# Patient Record
Sex: Female | Born: 1985
Health system: Southern US, Community
[De-identification: ages and names within clinical notes are randomized; demographics above are authoritative.]

## PROBLEM LIST (undated history)

## (undated) DIAGNOSIS — R519 Headache, unspecified: Secondary | ICD-10-CM

## (undated) DIAGNOSIS — J45909 Unspecified asthma, uncomplicated: Secondary | ICD-10-CM

## (undated) DIAGNOSIS — F32A Depression, unspecified: Secondary | ICD-10-CM

## (undated) DIAGNOSIS — F329 Major depressive disorder, single episode, unspecified: Secondary | ICD-10-CM

## (undated) DIAGNOSIS — D649 Anemia, unspecified: Secondary | ICD-10-CM

## (undated) DIAGNOSIS — R51 Headache: Secondary | ICD-10-CM

---

## 2016-05-12 ENCOUNTER — Emergency Department (HOSPITAL_COMMUNITY): Payer: Medicare Other

## 2016-05-12 ENCOUNTER — Encounter (HOSPITAL_COMMUNITY): Payer: Self-pay | Admitting: *Deleted

## 2016-05-12 ENCOUNTER — Emergency Department (HOSPITAL_COMMUNITY)
Admission: EM | Admit: 2016-05-12 | Discharge: 2016-05-13 | Disposition: A | Discharge status: Home / Self Care | Payer: Medicare Other | Attending: Emergency Medicine | Admitting: Emergency Medicine

## 2016-05-12 DIAGNOSIS — J45909 Unspecified asthma, uncomplicated: Secondary | ICD-10-CM | POA: Insufficient documentation

## 2016-05-12 DIAGNOSIS — R000 Tachycardia, unspecified: Secondary | ICD-10-CM | POA: Insufficient documentation

## 2016-05-12 DIAGNOSIS — J020 Streptococcal pharyngitis: Secondary | ICD-10-CM | POA: Insufficient documentation

## 2016-05-12 DIAGNOSIS — Z79899 Other long term (current) drug therapy: Secondary | ICD-10-CM | POA: Insufficient documentation

## 2016-05-12 DIAGNOSIS — R002 Palpitations: Secondary | ICD-10-CM | POA: Diagnosis present

## 2016-05-12 HISTORY — DX: Unspecified asthma, uncomplicated: J45.909

## 2016-05-12 LAB — CBC WITH DIFFERENTIAL/PLATELET
Basophils Absolute: 0 10*3/uL (ref 0.0–0.1)
Basophils Relative: 0 %
Eosinophils Absolute: 0.1 10*3/uL (ref 0.0–0.7)
Eosinophils Relative: 0 %
HEMATOCRIT: 31.4 % — AB (ref 36.0–46.0)
HEMOGLOBIN: 9.5 g/dL — AB (ref 12.0–15.0)
LYMPHS PCT: 2 %
Lymphs Abs: 0.3 10*3/uL — ABNORMAL LOW (ref 0.7–4.0)
MCH: 19.6 pg — ABNORMAL LOW (ref 26.0–34.0)
MCHC: 30.3 g/dL (ref 30.0–36.0)
MCV: 64.9 fL — ABNORMAL LOW (ref 78.0–100.0)
Monocytes Absolute: 0.4 10*3/uL (ref 0.1–1.0)
Monocytes Relative: 3 %
NEUTROS ABS: 14.3 10*3/uL — AB (ref 1.7–7.7)
Neutrophils Relative %: 95 %
Platelets: 412 10*3/uL — ABNORMAL HIGH (ref 150–400)
RBC: 4.84 MIL/uL (ref 3.87–5.11)
RDW: 17.4 % — ABNORMAL HIGH (ref 11.5–15.5)
WBC: 15.1 10*3/uL — AB (ref 4.0–10.5)

## 2016-05-12 LAB — D-DIMER, QUANTITATIVE (NOT AT ARMC): D DIMER QUANT: 0.39 ug{FEU}/mL (ref 0.00–0.50)

## 2016-05-12 LAB — I-STAT CHEM 8, ED
BUN: 9 mg/dL (ref 6–20)
CHLORIDE: 106 mmol/L (ref 101–111)
Calcium, Ion: 1.25 mmol/L (ref 1.15–1.40)
Creatinine, Ser: 0.5 mg/dL (ref 0.44–1.00)
GLUCOSE: 111 mg/dL — AB (ref 65–99)
HCT: 34 % — ABNORMAL LOW (ref 36.0–46.0)
Hemoglobin: 11.6 g/dL — ABNORMAL LOW (ref 12.0–15.0)
POTASSIUM: 3.3 mmol/L — AB (ref 3.5–5.1)
SODIUM: 141 mmol/L (ref 135–145)
TCO2: 23 mmol/L (ref 0–100)

## 2016-05-12 LAB — BASIC METABOLIC PANEL
ANION GAP: 8 (ref 5–15)
BUN: 9 mg/dL (ref 6–20)
CALCIUM: 9.1 mg/dL (ref 8.9–10.3)
CHLORIDE: 106 mmol/L (ref 101–111)
CO2: 23 mmol/L (ref 22–32)
Creatinine, Ser: 0.55 mg/dL (ref 0.44–1.00)
GFR calc non Af Amer: >60 mL/min (ref >60)
Glucose, Bld: 117 mg/dL — ABNORMAL HIGH (ref 65–99)
POTASSIUM: 3.5 mmol/L (ref 3.5–5.1)
Sodium: 137 mmol/L (ref 135–145)

## 2016-05-12 LAB — URINALYSIS, ROUTINE W REFLEX MICROSCOPIC
Bilirubin Urine: NEGATIVE
Glucose, UA: NEGATIVE mg/dL
HGB URINE DIPSTICK: NEGATIVE
Ketones, ur: 20 mg/dL — AB
LEUKOCYTES UA: NEGATIVE
NITRITE: NEGATIVE
PROTEIN: NEGATIVE mg/dL
Specific Gravity, Urine: 1.042 — ABNORMAL HIGH (ref 1.005–1.030)
pH: 7 (ref 5.0–8.0)

## 2016-05-12 LAB — RAPID URINE DRUG SCREEN, HOSP PERFORMED
AMPHETAMINES: NOT DETECTED
BARBITURATES: NOT DETECTED
Benzodiazepines: NOT DETECTED
Cocaine: NOT DETECTED
OPIATES: POSITIVE — AB
TETRAHYDROCANNABINOL: POSITIVE — AB

## 2016-05-12 LAB — I-STAT BETA HCG BLOOD, ED (MC, WL, AP ONLY): I-stat hCG, quantitative: <5 m[IU]/mL (ref <5)

## 2016-05-12 LAB — RAPID STREP SCREEN (MED CTR MEBANE ONLY): Streptococcus, Group A Screen (Direct): POSITIVE — AB

## 2016-05-12 LAB — I-STAT CG4 LACTIC ACID, ED
Lactic Acid, Venous: 1.72 mmol/L (ref 0.5–1.9)
Lactic Acid, Venous: 1.57 mmol/L (ref 0.5–1.9)

## 2016-05-12 MED ORDER — SODIUM CHLORIDE 0.9 % IV BOLUS (SEPSIS)
1000.0000 mL | Freq: Once | INTRAVENOUS | Status: AC
  Administered 2016-05-12: 1000 mL via INTRAVENOUS

## 2016-05-12 MED ORDER — MAGNESIUM SULFATE 2 GM/50ML IV SOLN: 2.0000 g | Freq: Once | INTRAVENOUS | Status: DC

## 2016-05-12 MED ORDER — IOPAMIDOL (ISOVUE-370) INJECTION 76%
INTRAVENOUS | Status: AC
  Filled 2016-05-12: qty 100

## 2016-05-12 MED ORDER — IPRATROPIUM-ALBUTEROL 0.5-2.5 (3) MG/3ML IN SOLN: 3.0000 mL | Freq: Once | RESPIRATORY_TRACT | Status: DC

## 2016-05-12 MED ORDER — METHYLPREDNISOLONE SODIUM SUCC 125 MG IJ SOLR: 125.0000 mg | Freq: Every day | INTRAMUSCULAR | Status: DC

## 2016-05-12 MED ORDER — IOPAMIDOL (ISOVUE-370) INJECTION 76%
100.0000 mL | Freq: Once | INTRAVENOUS | Status: AC | PRN
  Administered 2016-05-12: 75 mL via INTRAVENOUS

## 2016-05-12 MED ORDER — IOPAMIDOL (ISOVUE-300) INJECTION 61%
75.0000 mL | Freq: Once | INTRAVENOUS | Status: AC | PRN
  Administered 2016-05-12: 75 mL via INTRAVENOUS

## 2016-05-12 MED ORDER — SODIUM CHLORIDE 0.9 % IV BOLUS (SEPSIS)
2000.0000 mL | Freq: Once | INTRAVENOUS | Status: AC
  Administered 2016-05-12: 2000 mL via INTRAVENOUS

## 2016-05-12 MED ORDER — AMOXICILLIN 500 MG PO CAPS
500.0000 mg | ORAL_CAPSULE | Freq: Once | ORAL | Status: AC
  Administered 2016-05-12: 500 mg via ORAL
  Filled 2016-05-12: qty 1

## 2016-05-12 MED ORDER — ONDANSETRON HCL 4 MG/2ML IJ SOLN
4.0000 mg | Freq: Once | INTRAMUSCULAR | Status: AC
  Administered 2016-05-12: 4 mg via INTRAVENOUS
  Filled 2016-05-12: qty 2

## 2016-05-12 MED ORDER — IOPAMIDOL (ISOVUE-370) INJECTION 76%: 100.0000 mL | Freq: Once | INTRAVENOUS | Status: DC | PRN

## 2016-05-12 MED ORDER — HALOPERIDOL LACTATE 5 MG/ML IJ SOLN
5.0000 mg | Freq: Once | INTRAMUSCULAR | Status: AC
  Administered 2016-05-12: 5 mg via INTRAVENOUS
  Filled 2016-05-12: qty 1

## 2016-05-12 MED ORDER — LORAZEPAM 2 MG/ML IJ SOLN
2.0000 mg | Freq: Once | INTRAMUSCULAR | Status: AC
  Administered 2016-05-12: 2 mg via INTRAVENOUS
  Filled 2016-05-12: qty 1

## 2016-05-12 MED ORDER — MORPHINE SULFATE (PF) 4 MG/ML IV SOLN
4.0000 mg | Freq: Once | INTRAVENOUS | Status: AC
  Administered 2016-05-12: 4 mg via INTRAVENOUS
  Filled 2016-05-12: qty 1

## 2016-05-12 MED ORDER — DEXAMETHASONE SODIUM PHOSPHATE 10 MG/ML IJ SOLN
10.0000 mg | Freq: Once | INTRAMUSCULAR | Status: AC
  Administered 2016-05-12: 10 mg via INTRAVENOUS
  Filled 2016-05-12: qty 1

## 2016-05-12 NOTE — ED Notes (Signed)
Hospitalist at bedside 

## 2016-05-12 NOTE — ED Notes (Signed)
Instructed pt that we need her to lay in the bed so she doesn't fall. Pt will put both feet in bed for 2 min. Then pt sits on side of bed and rocks.

## 2016-05-12 NOTE — ED Provider Notes (Signed)
[facility]-EMERGENCY DEPT Provider Note   CSN: [id number] Arrival date & time: 05/12/16  1705     History   Chief Complaint Chief Complaint  Patient presents with  . Cough  . Shortness of Breath   HPI   Blood pressure 124/79, pulse (!) 144, temperature 99.2 F (37.3 C), temperature source Oral, resp. rate 16, last menstrual period 05/09/2016, SpO2 100 %.  Alexandra Ross is a 31 y.o. female with past medical history significant for asthma, has never been hospitalized or intubated for complications) complaining of racing heart and sore throat and difficulty swallowing worsening over the course of 3 days. He has been around people that are sick with strep. She denies fevers, chills, nausea, vomiting, history of DVT/PE, recent immobilizations, calf pain, leg swelling. She denies illicit drug use, alcohol intoxication, psychiatric history psychiatric medications.   Past Medical History:  Diagnosis Date  . Asthma     There are no active problems to display for this patient.   Past Surgical History:  Procedure Laterality Date  . CESAREAN SECTION      OB History    No data available       Home Medications    Prior to Admission medications   Medication Sig Start Date End Date Taking? Authorizing Provider  amoxicillin (AMOXIL) 500 MG capsule Take 2 capsules (1,000 mg total) by mouth 2 (two) times daily. 05/13/16   Joni Reining Budd Freiermuth, PA-C    Family History No family history on file.  Social History Social History  Substance Use Topics  . Smoking status: Never Smoker  . Smokeless tobacco: Never Used  . Alcohol use No     Allergies   Patient has no known allergies.   Review of Systems Review of Systems  10 systems reviewed and found to be negative, except as noted in the HPI.   Physical Exam Updated Vital Signs BP 106/65   Pulse (!) 125   Temp 99.9 F (37.7 C) (Rectal)   Resp 20   LMP 05/09/2016   SpO2 96%   Physical Exam  Constitutional: She is oriented to  person, place, and time. She appears well-developed and well-nourished.  Leaning to her right, will not sit up, states that it is very painful to speak and is asking for a pen to write instead of talking.  HENT:  Head: Normocephalic and atraumatic.  Mouth/Throat: Oropharynx is clear and moist.  Mucous membranes moist, uvula midline, soft palate rises symmetrically, mild tonsillar hypertrophy bilaterally, patient is spitting out her secretions  Eyes: Conjunctivae and EOM are normal. Pupils are equal, round, and reactive to light.  Neck: Normal range of motion.  Cardiovascular: Regular rhythm and intact distal pulses.   Severe tachycardia, grossly regular  Pulmonary/Chest: Effort normal and breath sounds normal.  Abdominal: Soft. There is no tenderness.  Musculoskeletal: Normal range of motion.  Neurological: She is alert and oriented to person, place, and time.  Skin: She is not diaphoretic.  Psychiatric: She has a normal mood and affect.  Nursing note and vitals reviewed.    ED Treatments / Results  Labs (all labs ordered are listed, but only abnormal results are displayed) Labs Reviewed  RAPID STREP SCREEN (NOT AT [facility]) - Abnormal; Notable for the following:       Result Value   Streptococcus, Group A Screen (Direct) POSITIVE (*)    All other components within normal limits  CBC WITH DIFFERENTIAL/PLATELET - Abnormal; Notable for the following:    WBC 15.1 (*)  Hemoglobin 9.5 (*)    HCT 31.4 (*)    MCV 64.9 (*)    MCH 19.6 (*)    RDW 17.4 (*)    Platelets 412 (*)    Neutro Abs 14.3 (*)    Lymphs Abs 0.3 (*)    All other components within normal limits  BASIC METABOLIC PANEL - Abnormal; Notable for the following:    Glucose, Bld 117 (*)    All other components within normal limits  URINALYSIS, ROUTINE W REFLEX MICROSCOPIC - Abnormal; Notable for the following:    APPearance HAZY (*)    Specific Gravity, Urine 1.042 (*)    Ketones, ur 20 (*)    All other components  within normal limits  RAPID URINE DRUG SCREEN, HOSP PERFORMED - Abnormal; Notable for the following:    Opiates POSITIVE (*)    Tetrahydrocannabinol POSITIVE (*)    All other components within normal limits  I-STAT CHEM 8, ED - Abnormal; Notable for the following:    Potassium 3.3 (*)    Glucose, Bld 111 (*)    Hemoglobin 11.6 (*)    HCT 34.0 (*)    All other components within normal limits  D-DIMER, QUANTITATIVE (NOT AT [facility])  I-STAT CG4 LACTIC ACID, ED  I-STAT BETA HCG BLOOD, ED ([facility], [facility], [facility] ONLY)  I-STAT CG4 LACTIC ACID, ED    EKG  EKG Interpretation None       Radiology Ct Soft Tissue Neck W Contrast  Result Date: 05/12/2016 CLINICAL DATA:  Sore throat, difficulty swallowing for 3 days. EXAM: CT NECK WITH CONTRAST TECHNIQUE: Multidetector CT imaging of the neck was performed using the standard protocol following the bolus administration of intravenous contrast. CONTRAST:  75mL ISOVUE-300 IOPAMIDOL (ISOVUE-300) INJECTION 61% COMPARISON:  None. FINDINGS: Mild motion degraded examination. Pharynx and larynx: Normal. Salivary glands: Normal. Thyroid: Normal. Lymph nodes: No lymphadenopathy by CT size criteria. Vascular: Normal. Limited intracranial: Normal. Visualized orbits: Normal. Mastoids and visualized paranasal sinuses: Trace paranasal sinus mucosal thickening with subcentimeter LEFT maxillary mucosal retention cyst. Mastoid air cells are well aerated. Skeleton: No acute osseous process or destructive bony lesions. Dental malocclusion. In Upper chest: Lung apices are well aerated. No superior mediastinal lymphadenopathy. Minimal residual thymic tissue. Other: None. IMPRESSION: Negative mildly motion degraded CT neck. Electronically Signed   By: Awilda Metro M.D.   On: 05/12/2016 19:58   Ct Angio Chest Pe W And/or Wo Contrast  Result Date: 05/12/2016 CLINICAL DATA:  Tachycardia. Cough and chills. Chest pain and shortness of breath. EXAM: CT ANGIOGRAPHY CHEST WITH CONTRAST  TECHNIQUE: Multidetector CT imaging of the chest was performed using the standard protocol during bolus administration of intravenous contrast. Multiplanar CT image reconstructions and MIPs were obtained to evaluate the vascular anatomy. CONTRAST:  75 cc Isovue 370 IV COMPARISON:  Radiograph earlier this day FINDINGS: Cardiovascular: There are no filling defects within the pulmonary arteries to suggest pulmonary embolus. No thoracic aortic dissection or aneurysm. Normal heart size. No pericardial effusion. Mediastinum/Nodes: No mediastinal or hilar adenopathy. The esophagus is decompressed. Visualized thyroid gland is unremarkable. Lungs/Pleura: Mild motion artifact. No consolidation, pulmonary edema or pleural fluid. Tiny subpleural opacity in the right upper lobe is likely atelectasis or scarring. No pulmonary mass or suspicious nodule. Upper Abdomen: No acute abnormality. Musculoskeletal: There are no acute or suspicious osseous abnormalities. Small Schmorl's nodes in the low thoracic spine. Review of the MIP images confirms the above findings. IMPRESSION: No pulmonary embolus or acute intrathoracic abnormality. Electronically Signed  By: Rubye Oaks M.D.   On: 05/12/2016 22:53   Dg Chest Portable 1 View  Result Date: 05/12/2016 CLINICAL DATA:  Cough and chills for 2 days, shortness of breath and mid chest pain. Tachycardia. History of asthma. EXAM: PORTABLE CHEST 1 VIEW COMPARISON:  None. FINDINGS: The heart size and mediastinal contours are within normal limits. Both lungs are clear. The visualized skeletal structures are unremarkable. IMPRESSION: Normal chest. Electronically Signed   By: Awilda Metro M.D.   On: 05/12/2016 18:14    Procedures Procedures (including critical care time)  Medications Ordered in ED Medications  iopamidol (ISOVUE-370) 76 % injection (not administered)  sodium chloride 0.9 % bolus 1,000 mL (0 mLs Intravenous Stopped 05/12/16 1939)  dexamethasone (DECADRON)  injection 10 mg (10 mg Intravenous Given 05/12/16 1831)  morphine 4 MG/ML injection 4 mg (4 mg Intravenous Given 05/12/16 1831)  ondansetron (ZOFRAN) injection 4 mg (4 mg Intravenous Given 05/12/16 1831)  sodium chloride 0.9 % bolus 2,000 mL (0 mLs Intravenous Stopped 05/12/16 2356)  iopamidol (ISOVUE-300) 61 % injection 75 mL (75 mLs Intravenous Contrast Given 05/12/16 1911)  LORazepam (ATIVAN) injection 2 mg (2 mg Intravenous Given 05/12/16 2047)  amoxicillin (AMOXIL) capsule 500 mg (500 mg Oral Given 05/12/16 2047)  sodium chloride 0.9 % bolus 1,000 mL (1,000 mLs Intravenous New Bag/Given 05/12/16 2206)  haloperidol lactate (HALDOL) injection 5 mg (5 mg Intravenous Given 05/12/16 2206)  iopamidol (ISOVUE-370) 76 % injection 100 mL (75 mLs Intravenous Contrast Given 05/12/16 2232)     Initial Impression / Assessment and Plan / ED Course  I have reviewed the triage vital signs and the nursing notes.  Pertinent labs & imaging results that were available during my care of the patient were reviewed by me and considered in my medical decision making (see chart for details).    Vitals:   05/12/16 2310 05/12/16 2351 05/13/16 0000 05/13/16 0041  BP: 105/57 111/65 95/72 106/65  Pulse: (!) 123 (!) 139 (!) 130 (!) 125  Resp: 17 16  20   Temp: 99.9 F (37.7 C)     TempSrc: Rectal     SpO2: 100% 99% 99% 96%    Medications  iopamidol (ISOVUE-370) 76 % injection (not administered)  sodium chloride 0.9 % bolus 1,000 mL (0 mLs Intravenous Stopped 05/12/16 1939)  dexamethasone (DECADRON) injection 10 mg (10 mg Intravenous Given 05/12/16 1831)  morphine 4 MG/ML injection 4 mg (4 mg Intravenous Given 05/12/16 1831)  ondansetron (ZOFRAN) injection 4 mg (4 mg Intravenous Given 05/12/16 1831)  sodium chloride 0.9 % bolus 2,000 mL (0 mLs Intravenous Stopped 05/12/16 2356)  iopamidol (ISOVUE-300) 61 % injection 75 mL (75 mLs Intravenous Contrast Given 05/12/16 1911)  LORazepam (ATIVAN) injection 2 mg (2 mg Intravenous  Given 05/12/16 2047)  amoxicillin (AMOXIL) capsule 500 mg (500 mg Oral Given 05/12/16 2047)  sodium chloride 0.9 % bolus 1,000 mL (1,000 mLs Intravenous New Bag/Given 05/12/16 2206)  haloperidol lactate (HALDOL) injection 5 mg (5 mg Intravenous Given 05/12/16 2206)  iopamidol (ISOVUE-370) 76 % injection 100 mL (75 mLs Intravenous Contrast Given 05/12/16 2232)    Alexandra Ross is 31 y.o. female presenting with Cough, sore throat, severe tachycardia in the 140s to 150s. Lung sounds are clear, she is spitting out her secretions, question epiglottitis versus PTA. Patient is afebrile, nontoxic appearing. Will check a CT soft tissue neck.   Patient declines strep test states the pain is too severe, she's been given morphine, Decadron and aggressive hydration.  CT of  the neck is negative, advised her we will need to get a strep test and I have personally swabbed her tonsils.  Rapid strep is positive, patient started on amoxicillin, she is not vomiting, she remains persistently tachycardic despite pain medication and hydration. Will try Ativan.  Patient has taken off her pants and hung her gluteus over the edge of the bed. When questioned by nurses to why she did this she states "I have to go to the bathroom." She is encouraged to use her call bell and covered with gown and blanket.   After 2 mg of Ativan she remains tachycardic in the 120s. She's had a normal lactic acid 2, patient is sleeping with heart rate in the 120s. Will obtain UDS, CTA, check rectal temperature .   Geodon given and patient persistently remains tachycardic.  UDS was no cocaine/amphetamines. Positive for opiates (morphine was given for pain earlier in visit) and also THC. Does not explain her tachycardia.  She has received 4L of fluids, Rectal temperature normal, CTA negative. Will need admission for persistent tachycardia.  Consult from triad hospitalist Dr. Katrinka BlazingSmith appreciated: He will evaluate the patient.   Dr. Katrinka BlazingSmith states  that the patient is appropriate for discharge, as she is tolerating by mouth's and able to take her medications at home. Extensive discussion with Dr. Katrinka BlazingSmith on the degree of tachycardia that this patient is exhibiting, he feels comfortable sending her, and again declines admission. Discussed with attending physician, patient given extensive return precautions and she verbalized understanding and teach back technique. Nurse will outpatient chart her phone so she can call family member to pick her up.   Final Clinical Impressions(s) / ED Diagnoses   Final diagnoses:  Strep pharyngitis  Tachycardia    New Prescriptions New Prescriptions   AMOXICILLIN (AMOXIL) 500 MG CAPSULE    Take 2 capsules (1,000 mg total) by mouth 2 (two) times daily.     Alexandra Emeryicole Arayah Krouse, PA-C 05/13/16 0050    Shaune Pollackameron Isaacs, MD 05/13/16 1226

## 2016-05-12 NOTE — ED Notes (Signed)
Pt pulled out her IV and left it running in the floor. When this RN discovered it, she states that it leaked so she pulled it out.

## 2016-05-12 NOTE — ED Triage Notes (Signed)
Pt reports not feeling well for the past few days.  Pt reports coughing and chills.  Pt reports SOB and pain in chest.  Pt denies N/V.

## 2016-05-12 NOTE — ED Notes (Signed)
Pt keeps spitting in emesis bag. Pt asked for crackers, informed pt that I can not provide her crackers if she is vomiting.

## 2016-05-13 DIAGNOSIS — R000 Tachycardia, unspecified: Secondary | ICD-10-CM | POA: Diagnosis not present

## 2016-05-13 DIAGNOSIS — J020 Streptococcal pharyngitis: Secondary | ICD-10-CM

## 2016-05-13 MED ORDER — AMOXICILLIN 500 MG PO CAPS: 1000.0000 mg | ORAL_CAPSULE | Freq: Two times a day (BID) | ORAL | 0 refills | Status: DC

## 2016-05-13 MED ORDER — SODIUM CHLORIDE 0.9 % IV BOLUS (SEPSIS)
1000.0000 mL | Freq: Once | INTRAVENOUS | Status: AC
  Administered 2016-05-13: 1000 mL via INTRAVENOUS

## 2016-05-13 NOTE — ED Notes (Signed)
Pt calling for her ride

## 2016-05-13 NOTE — Discharge Instructions (Signed)
Do not hesitate to return to the emergency department for any new, worsening or concerning symptoms.

## 2016-05-13 NOTE — ED Notes (Signed)
Pt ambulated to the restroom without any assistance. 

## 2016-05-13 NOTE — ED Notes (Signed)
Pt left phone in the bathroom. Phone was returned by nurse tech

## 2016-05-13 NOTE — ED Notes (Signed)
Pt ambulated to restroom without difficulty

## 2016-05-13 NOTE — ED Notes (Signed)
Pt stood up while looking for her phone and vomited green liquid.

## 2016-05-13 NOTE — ED Provider Notes (Signed)
Asked by charge nurse to see patient. Patient was seen earlier this evening and after extensive evaluation was found to have strep pharyngitis. She however has continued to have tachycardia with soft blood pressures. Nurse reports patient seems confused however she did get Haldol and lorazepam earlier in the evening.  4:35 AM patient's heart rate is 115 and jumped up to 129 when I woke her up, blood pressure 101/65 with pulse ox 100%. She states she's not feeling much better.  04:59 Dr Arlyss Queen Smith, states he felt her altered mental status was from the ativan and haldol. Does not feel she needs to be admitted.      Alexandra AlbeIva Raekwon Winkowski, MD 05/13/16 [phone]

## 2016-05-13 NOTE — Progress Notes (Signed)
31 year old female pmh of asthma; who presents with complains of cough, shortness of breath, and sore throat for last 3 days. Initial vitals revealed pulse of 144, respiration rate of 25, but all other vitals within normal limits. Lab work revealed WBC of 15.1, positive for strep throa, UDS positive for marijuana( also found to be positive for opioids, but patient given morphine). She was started on oral amoxicillin and ordered 4 L of IV fluids. It appears patient with persistent tachycardia with EKG revealing sinus tachycardia with no ischemic changes. Workup including CT of the neck and CT angiogram of the chest otherwise noted to be negative. In addition to completing fluids appears she was given morphine, Ativan, and Haldol for treatment of tachycardia. Patient to be resting with heart rates in the 120s completing rest of fluid bolus no need of admission at this time.

## 2016-05-13 NOTE — ED Notes (Signed)
Pt phone located. Charging it now in pt room.

## 2016-06-17 ENCOUNTER — Inpatient Hospital Stay (HOSPITAL_COMMUNITY)
Admission: AD | Admit: 2016-06-17 | Discharge: 2016-06-17 | Disposition: A | Discharge status: Home / Self Care | Payer: Medicare Other | Source: Ambulatory Visit | Attending: Obstetrics and Gynecology | Admitting: Obstetrics and Gynecology

## 2016-06-17 ENCOUNTER — Encounter (HOSPITAL_COMMUNITY): Payer: Self-pay | Admitting: *Deleted

## 2016-06-17 DIAGNOSIS — M549 Dorsalgia, unspecified: Secondary | ICD-10-CM | POA: Insufficient documentation

## 2016-06-17 DIAGNOSIS — B9689 Other specified bacterial agents as the cause of diseases classified elsewhere: Secondary | ICD-10-CM | POA: Insufficient documentation

## 2016-06-17 DIAGNOSIS — Z9889 Other specified postprocedural states: Secondary | ICD-10-CM | POA: Diagnosis not present

## 2016-06-17 DIAGNOSIS — Z8249 Family history of ischemic heart disease and other diseases of the circulatory system: Secondary | ICD-10-CM | POA: Insufficient documentation

## 2016-06-17 DIAGNOSIS — B3731 Acute candidiasis of vulva and vagina: Secondary | ICD-10-CM

## 2016-06-17 DIAGNOSIS — F329 Major depressive disorder, single episode, unspecified: Secondary | ICD-10-CM | POA: Insufficient documentation

## 2016-06-17 DIAGNOSIS — Z833 Family history of diabetes mellitus: Secondary | ICD-10-CM | POA: Diagnosis not present

## 2016-06-17 DIAGNOSIS — N760 Acute vaginitis: Secondary | ICD-10-CM

## 2016-06-17 DIAGNOSIS — B373 Candidiasis of vulva and vagina: Secondary | ICD-10-CM | POA: Diagnosis not present

## 2016-06-17 DIAGNOSIS — Z8489 Family history of other specified conditions: Secondary | ICD-10-CM | POA: Insufficient documentation

## 2016-06-17 DIAGNOSIS — J45909 Unspecified asthma, uncomplicated: Secondary | ICD-10-CM | POA: Insufficient documentation

## 2016-06-17 DIAGNOSIS — Z113 Encounter for screening for infections with a predominantly sexual mode of transmission: Secondary | ICD-10-CM | POA: Diagnosis not present

## 2016-06-17 HISTORY — DX: Depression, unspecified: F32.A

## 2016-06-17 HISTORY — DX: Major depressive disorder, single episode, unspecified: F32.9

## 2016-06-17 HISTORY — DX: Headache, unspecified: R51.9

## 2016-06-17 HISTORY — DX: Anemia, unspecified: D64.9

## 2016-06-17 HISTORY — DX: Headache: R51

## 2016-06-17 LAB — URINALYSIS, ROUTINE W REFLEX MICROSCOPIC
BILIRUBIN URINE: NEGATIVE
Glucose, UA: NEGATIVE mg/dL
HGB URINE DIPSTICK: NEGATIVE
KETONES UR: 5 mg/dL — AB
Nitrite: NEGATIVE
PROTEIN: NEGATIVE mg/dL
Specific Gravity, Urine: 1.023 (ref 1.005–1.030)
pH: 5 (ref 5.0–8.0)

## 2016-06-17 LAB — CBC
HCT: 29.7 % — ABNORMAL LOW (ref 36.0–46.0)
Hemoglobin: 9 g/dL — ABNORMAL LOW (ref 12.0–15.0)
MCH: 19.4 pg — ABNORMAL LOW (ref 26.0–34.0)
MCHC: 30.3 g/dL (ref 30.0–36.0)
MCV: 63.9 fL — ABNORMAL LOW (ref 78.0–100.0)
PLATELETS: 335 10*3/uL (ref 150–400)
RBC: 4.65 MIL/uL (ref 3.87–5.11)
RDW: 17.7 % — AB (ref 11.5–15.5)
WBC: 7.2 10*3/uL (ref 4.0–10.5)

## 2016-06-17 LAB — POCT PREGNANCY, URINE: Preg Test, Ur: NEGATIVE

## 2016-06-17 LAB — WET PREP, GENITAL
Sperm: NONE SEEN
TRICH WET PREP: NONE SEEN

## 2016-06-17 MED ORDER — METRONIDAZOLE 0.75 % VA GEL: 1.0000 | Freq: Two times a day (BID) | VAGINAL | 0 refills | Status: DC

## 2016-06-17 MED ORDER — FLUCONAZOLE 150 MG PO TABS: 150.0000 mg | ORAL_TABLET | Freq: Every day | ORAL | 1 refills | Status: DC

## 2016-06-17 NOTE — MAU Provider Note (Signed)
History     CSN: [id number]  Arrival date and time: 06/17/16 4098   First Provider Initiated Contact with Patient 06/17/16 [phone]      Chief Complaint  Patient presents with  . Vaginal Discharge  . Back Pain   HPI Alexandra Ross is a 31 y.o. female who presents for vaginal discharge. Patient requesting STI screening & UPT today. Has not taken HPT.  Reports vaginal discharge x 1 week. Describes as thick yellow discharge. No odor. Vaginal irritation & itching present. Denies abdominal pain, vaginal bleeding, dysuria, dyspareunia, or post coital bleeding. Has not been sexually active in 3 months, has had no known STD exposure. Not using contraception. Hx of gonorrhea 10+ years ago.   Past Medical History:  Diagnosis Date  . Anemia   . Asthma   . Depression    doing ok, never on meds, no official dx  . Headache     Past Surgical History:  Procedure Laterality Date  . CESAREAN SECTION      Family History  Problem Relation Age of Onset  . Hypertension Mother   . Lupus Mother   . Diabetes Maternal Grandmother   . Hypertension Maternal Grandmother   . Anuerysm Paternal Grandmother     Social History  Substance Use Topics  . Smoking status: Never Smoker  . Smokeless tobacco: Never Used  . Alcohol use No    Allergies: No Known Allergies  Prescriptions Prior to Admission  Medication Sig Dispense Refill Last Dose  . amoxicillin (AMOXIL) 500 MG capsule Take 2 capsules (1,000 mg total) by mouth 2 (two) times daily. 40 capsule 0     Review of Systems  Constitutional: Negative.   Gastrointestinal: Negative.   Genitourinary: Positive for vaginal discharge. Negative for dyspareunia, dysuria and vaginal bleeding.  Musculoskeletal: Positive for back pain.   Physical Exam   Blood pressure 101/61, pulse 93, temperature 98.4 F (36.9 C), temperature source Oral, resp. rate 16, weight 131 lb 4 oz (59.5 kg).  Physical Exam  Nursing note and vitals reviewed. Constitutional: She  is oriented to person, place, and time. She appears well-developed and well-nourished. No distress.  HENT:  Head: Normocephalic and atraumatic.  Eyes: Conjunctivae are normal. Right eye exhibits no discharge. Left eye exhibits no discharge. No scleral icterus.  Neck: Normal range of motion.  Respiratory: Effort normal. No respiratory distress.  GI: Soft. Bowel sounds are normal. There is no tenderness.  Genitourinary: Uterus normal. Cervix exhibits no motion tenderness and no friability. Right adnexum displays no mass and no tenderness. Left adnexum displays no mass and no tenderness. No bleeding in the vagina. Vaginal discharge found.  Genitourinary Comments: Small amount of thin white discharge  Neurological: She is alert and oriented to person, place, and time.  Skin: Skin is warm and dry. She is not diaphoretic.  Psychiatric: She has a normal mood and affect. Her behavior is normal. Judgment and thought content normal.   MAU Course  Procedures Results for orders placed or performed during the hospital encounter of 06/17/16 (from the past 24 hour(s))  Urinalysis, Routine w reflex microscopic (not at [facility])     Status: Abnormal   Collection Time: 06/17/16  8:40 AM  Result Value Ref Range   Color, Urine YELLOW YELLOW   APPearance HAZY (A) CLEAR   Specific Gravity, Urine 1.023 1.005 - 1.030   pH 5.0 5.0 - 8.0   Glucose, UA NEGATIVE NEGATIVE mg/dL   Hgb urine dipstick NEGATIVE NEGATIVE   Bilirubin Urine NEGATIVE  NEGATIVE   Ketones, ur 5 (A) NEGATIVE mg/dL   Protein, ur NEGATIVE NEGATIVE mg/dL   Nitrite NEGATIVE NEGATIVE   Leukocytes, UA LARGE (A) NEGATIVE   RBC / HPF 0-5 0 - 5 RBC/hpf   WBC, UA 6-30 0 - 5 WBC/hpf   Bacteria, UA RARE (A) NONE SEEN   Squamous Epithelial / LPF 0-5 (A) NONE SEEN   Mucous PRESENT    Hyaline Casts, UA PRESENT   Pregnancy, urine POC     Status: None   Collection Time: 06/17/16  8:46 AM  Result Value Ref Range   Preg Test, Ur NEGATIVE NEGATIVE  CBC      Status: Abnormal   Collection Time: 06/17/16  9:38 AM  Result Value Ref Range   WBC 7.2 4.0 - 10.5 K/uL   RBC 4.65 3.87 - 5.11 MIL/uL   Hemoglobin 9.0 (L) 12.0 - 15.0 g/dL   HCT 13.029.7 (L) 86.536.0 - 78.446.0 %   MCV 63.9 (L) 78.0 - 100.0 fL   MCH 19.4 (L) 26.0 - 34.0 pg   MCHC 30.3 30.0 - 36.0 g/dL   RDW 69.617.7 (H) 29.511.5 - 28.415.5 %   Platelets 335 150 - 400 K/uL  Wet prep, genital     Status: Abnormal   Collection Time: 06/17/16  9:57 AM  Result Value Ref Range   Yeast Wet Prep HPF POC PRESENT (A) NONE SEEN   Trich, Wet Prep NONE SEEN NONE SEEN   Clue Cells Wet Prep HPF POC PRESENT (A) NONE SEEN   WBC, Wet Prep HPF POC MODERATE (A) NONE SEEN   Sperm NONE SEEN     MDM UPT negative CBC, HIV, RPR, GC/CT, wet prep  Assessment and Plan  A: 1. Vaginal yeast infection   2. BV (bacterial vaginosis)   3. Screen for STD (sexually transmitted disease)    P: Discharge home Rx metrogel & diflucan GC/CT, HIV, RPR pending Contact PCP for routine care [facility]  Alexandra Ross 06/17/2016, 9:03 AM

## 2016-06-17 NOTE — MAU Note (Signed)
Hard copy of dc signature to medical records

## 2016-06-17 NOTE — Discharge Instructions (Signed)
Bacterial Vaginosis Bacterial vaginosis is a vaginal infection that occurs when the normal balance of bacteria in the vagina is disrupted. It results from an overgrowth of certain bacteria. This is the most common vaginal infection among women ages 15-44. Because bacterial vaginosis increases your risk for STIs (sexually transmitted infections), getting treated can help reduce your risk for chlamydia, gonorrhea, herpes, and HIV (human immunodeficiency virus). Treatment is also important for preventing complications in pregnant women, because this condition can cause an early (premature) delivery. What are the causes? This condition is caused by an increase in harmful bacteria that are normally present in small amounts in the vagina. However, the reason that the condition develops is not fully understood. What increases the risk? The following factors may make you more likely to develop this condition:  Having a new sexual partner or multiple sexual partners.  Having unprotected sex.  Douching.  Having an intrauterine device (IUD).  Smoking.  Drug and alcohol abuse.  Taking certain antibiotic medicines.  Being pregnant. You cannot get bacterial vaginosis from toilet seats, bedding, swimming pools, or contact with objects around you. What are the signs or symptoms? Symptoms of this condition include:  Grey or white vaginal discharge. The discharge can also be watery or foamy.  A fish-like odor with discharge, especially after sexual intercourse or during menstruation.  Itching in and around the vagina.  Burning or pain with urination. Some women with bacterial vaginosis have no signs or symptoms. How is this diagnosed? This condition is diagnosed based on:  Your medical history.  A physical exam of the vagina.  Testing a sample of vaginal fluid under a microscope to look for a large amount of bad bacteria or abnormal cells. Your health care provider may use a cotton swab or a  small wooden spatula to collect the sample. How is this treated? This condition is treated with antibiotics. These may be given as a pill, a vaginal cream, or a medicine that is put into the vagina (suppository). If the condition comes back after treatment, a second round of antibiotics may be needed. Follow these instructions at home: Medicines   Take over-the-counter and prescription medicines only as told by your health care provider.  Take or use your antibiotic as told by your health care provider. Do not stop taking or using the antibiotic even if you start to feel better. General instructions   If you have a female sexual partner, tell her that you have a vaginal infection. She should see her health care provider and be treated if she has symptoms. If you have a female sexual partner, he does not need treatment.  During treatment:  Avoid sexual activity until you finish treatment.  Do not douche.  Avoid alcohol as directed by your health care provider.  Avoid breastfeeding as directed by your health care provider.  Drink enough water and fluids to keep your urine clear or pale yellow.  Keep the area around your vagina and rectum clean.  Wash the area daily with warm water.  Wipe yourself from front to back after using the toilet.  Keep all follow-up visits as told by your health care provider. This is important. How is this prevented?  Do not douche.  Wash the outside of your vagina with warm water only.  Use protection when having sex. This includes latex condoms and dental dams.  Limit how many sexual partners you have. To help prevent bacterial vaginosis, it is best to have sex with just one   partner (monogamous).  Make sure you and your sexual partner are tested for STIs.  Wear cotton or cotton-lined underwear.  Avoid wearing tight pants and pantyhose, especially during summer.  Limit the amount of alcohol that you drink.  Do not use any products that contain  nicotine or tobacco, such as cigarettes and e-cigarettes. If you need help quitting, ask your health care provider.  Do not use illegal drugs. Where to find more information:  Centers for Disease Control and Prevention: www.cdc.gov/std  American Sexual Health Association (ASHA): www.ashastd.org  U.S. Department of Health and Human Services, Office on Women's Health: www.womenshealth.gov/ or https://www.womenshealth.gov/a-z-topics/bacterial-vaginosis Contact a health care provider if:  Your symptoms do not improve, even after treatment.  You have more discharge or pain when urinating.  You have a fever.  You have pain in your abdomen.  You have pain during sex.  You have vaginal bleeding between periods. Summary  Bacterial vaginosis is a vaginal infection that occurs when the normal balance of bacteria in the vagina is disrupted.  Because bacterial vaginosis increases your risk for STIs (sexually transmitted infections), getting treated can help reduce your risk for chlamydia, gonorrhea, herpes, and HIV (human immunodeficiency virus). Treatment is also important for preventing complications in pregnant women, because the condition can cause an early (premature) delivery.  This condition is treated with antibiotic medicines. These may be given as a pill, a vaginal cream, or a medicine that is put into the vagina (suppository). This information is not intended to replace advice given to you by your health care provider. Make sure you discuss any questions you have with your health care provider. Document Released: 04/11/2005 Document Revised: 12/26/2015 Document Reviewed: 12/26/2015 Elsevier Interactive Patient Education  2017 Elsevier Inc. Vaginal Yeast infection, Adult Vaginal yeast infection is a condition that causes soreness, swelling, and redness (inflammation) of the vagina. It also causes vaginal discharge. This is a common condition. Some women get this infection  frequently. What are the causes? This condition is caused by a change in the normal balance of the yeast (candida) and bacteria that live in the vagina. This change causes an overgrowth of yeast, which causes the inflammation. What increases the risk? This condition is more likely to develop in:  Women who take antibiotic medicines.  Women who have diabetes.  Women who take birth control pills.  Women who are pregnant.  Women who douche often.  Women who have a weak defense (immune) system.  Women who have been taking steroid medicines for a long time.  Women who frequently wear tight clothing. What are the signs or symptoms? Symptoms of this condition include:  White, thick vaginal discharge.  Swelling, itching, redness, and irritation of the vagina. The lips of the vagina (vulva) may be affected as well.  Pain or a burning feeling while urinating.  Pain during sex. How is this diagnosed? This condition is diagnosed with a medical history and physical exam. This will include a pelvic exam. Your health care provider will examine a sample of your vaginal discharge under a microscope. Your health care provider may send this sample for testing to confirm the diagnosis. How is this treated? This condition is treated with medicine. Medicines may be over-the-counter or prescription. You may be told to use one or more of the following:  Medicine that is taken orally.  Medicine that is applied as a cream.  Medicine that is inserted directly into the vagina (suppository). Follow these instructions at home:  Take or apply over-the-counter   and prescription medicines only as told by your health care provider.  Do not have sex until your health care provider has approved. Tell your sex partner that you have a yeast infection. That person should go to his or her health care provider if he or she develops symptoms.  Do not wear tight clothes, such as pantyhose or tight pants.  Avoid  using tampons until your health care provider approves.  Eat more yogurt. This may help to keep your yeast infection from returning.  Try taking a sitz bath to help with discomfort. This is a warm water bath that is taken while you are sitting down. The water should only come up to your hips and should cover your buttocks. Do this 3-4 times per day or as told by your health care provider.  Do not douche.  Wear breathable, cotton underwear.  If you have diabetes, keep your blood sugar levels under control. Contact a health care provider if:  You have a fever.  Your symptoms go away and then return.  Your symptoms do not get better with treatment.  Your symptoms get worse.  You have new symptoms.  You develop blisters in or around your vagina.  You have blood coming from your vagina and it is not your menstrual period.  You develop pain in your abdomen. This information is not intended to replace advice given to you by your health care provider. Make sure you discuss any questions you have with your health care provider. Document Released: 01/19/2005 Document Revised: 09/23/2015 Document Reviewed: 10/13/2014 Elsevier Interactive Patient Education  2017 Elsevier Inc.  

## 2016-06-17 NOTE — MAU Note (Signed)
Wanting to get checked out, wants to see if she is pregnant or has any STD's or HIV.  Keeps getting sick: back hurts, high fevers (102 2 days ago), yellow d/c. Has a stuffy nose, keeps on sneezing.

## 2016-06-18 LAB — RPR: RPR: NONREACTIVE

## 2016-06-18 LAB — HIV ANTIBODY (ROUTINE TESTING W REFLEX): HIV SCREEN 4TH GENERATION: NONREACTIVE

## 2016-06-20 LAB — GC/CHLAMYDIA PROBE AMP (~~LOC~~) NOT AT ARMC
Chlamydia: NEGATIVE
Neisseria Gonorrhea: NEGATIVE

## 2018-06-06 ENCOUNTER — Encounter: Payer: Self-pay | Admitting: Critical Care Medicine

## 2018-06-06 ENCOUNTER — Other Ambulatory Visit: Payer: Self-pay | Admitting: Critical Care Medicine

## 2018-06-06 NOTE — Progress Notes (Unsigned)
Seen for ? Of disability

## 2018-06-07 NOTE — Progress Notes (Signed)
This is an 33 year old female who had several questions regarding application to [employer].  She has a longstanding learning disability.  She labels it as mental retardation.  She has been in [place] now for 3 years.  She is able to read and write.  She has delays in learning.  She was also requesting financial assistance.   She is on no medications and has no medication allergies.   The patient does complain of left-sided lower quadrant pain associated with menstrual cycles.  She is not seen a gynecologist recently.  She has 3 children but they are under custody of a parent.  She also notes when she eats meats she will have loose stools.  On exam vital signs are stable Chest was clear Cardiac exam unremarkable Abdomen was soft nontender Extremities showed no edema HEENT exam unremarkable  Impression Will need to see gynecology for follow-up of her abnormal menstrual periods and left lower quadrant pain and will need financial assistance with an orange card to be able to achieve the gynecology appointment therefore she will complete application for orange card support  With regards to learning disability she will need a learning disability reassessment and we have given the PET patient access to information at the technical college in their department to give her the support she needs to apply for her GED and the proper information that is needed assessments that need to occur

## 2018-06-21 ENCOUNTER — Encounter (HOSPITAL_COMMUNITY): Payer: Self-pay | Admitting: Emergency Medicine

## 2018-06-21 ENCOUNTER — Emergency Department (HOSPITAL_COMMUNITY)
Admission: EM | Admit: 2018-06-21 | Discharge: 2018-06-21 | Disposition: A | Discharge status: Home / Self Care | Payer: Medicare Other | Attending: Emergency Medicine | Admitting: Emergency Medicine

## 2018-06-21 DIAGNOSIS — K0889 Other specified disorders of teeth and supporting structures: Secondary | ICD-10-CM

## 2018-06-21 DIAGNOSIS — K047 Periapical abscess without sinus: Secondary | ICD-10-CM

## 2018-06-21 DIAGNOSIS — Z590 Homelessness: Secondary | ICD-10-CM | POA: Insufficient documentation

## 2018-06-21 MED ORDER — PENICILLIN V POTASSIUM 500 MG PO TABS: 500.0000 mg | ORAL_TABLET | Freq: Four times a day (QID) | ORAL | 0 refills | Status: DC

## 2018-06-21 MED ORDER — IBUPROFEN 600 MG PO TABS: 600.0000 mg | ORAL_TABLET | Freq: Four times a day (QID) | ORAL | 0 refills | Status: DC | PRN

## 2018-06-21 MED ORDER — IBUPROFEN 400 MG PO TABS
600.0000 mg | ORAL_TABLET | Freq: Once | ORAL | Status: AC
  Administered 2018-06-21: 09:00:00 600 mg via ORAL
  Filled 2018-06-21: qty 1

## 2018-06-21 NOTE — ED Triage Notes (Signed)
Pt has dental pain in her right upper molars. Pt denies chipping a tooth, denies drainage.

## 2018-06-21 NOTE — Discharge Instructions (Addendum)
I have prescribed your antibiotics to treat your dental abscess, please take this as directed. I have also provided a prescription for pain medication please take this as directed. There are dental resources attached to your paperwork please call to be seen by a dentist.

## 2018-06-21 NOTE — ED Provider Notes (Signed)
[facility] EMERGENCY DEPARTMENT Provider Note   CSN: [id number] Arrival date & time: 06/21/18  0802    History   Chief Complaint Chief Complaint  Patient presents with  . Dental Pain    HPI Alexandra Ross is a 33 y.o. female.     33 y.o female with a PMH of Anemia, Asthma presents to the ED with a chief complaint of dental pain x 3 days. She reports pain along the right upper gum line with mastication. She states taking BC powders for pain but no improvement in symptoms.Patient is currently living at [facility] homeless shelter and states she has not seen a dentist in a really long time. She denies any fever, difficulty swallowing, facial swelling or pain with eye movement.      Past Medical History:  Diagnosis Date  . Anemia   . Asthma   . Depression    doing ok, never on meds, no official dx  . Headache     There are no active problems to display for this patient.   Past Surgical History:  Procedure Laterality Date  . CESAREAN SECTION       OB History    Gravida  3   Para  3   Term  3   Preterm      AB      Living  3     SAB      TAB      Ectopic      Multiple      Live Births           Obstetric Comments  First baby was breech         Home Medications    Prior to Admission medications   Medication Sig Start Date End Date Taking? Authorizing Provider  fluconazole (DIFLUCAN) 150 MG tablet Take 1 tablet (150 mg total) by mouth daily. 06/17/16   Judeth Horn, NP  ibuprofen (ADVIL,MOTRIN) 600 MG tablet Take 1 tablet (600 mg total) by mouth every 6 (six) hours as needed for up to 7 days. 06/21/18 06/28/18  Claude Manges, PA-C  metroNIDAZOLE (METROGEL VAGINAL) 0.75 % vaginal gel Place 1 Applicatorful vaginally 2 (two) times daily. 06/17/16   Judeth Horn, NP  penicillin v potassium (VEETID) 500 MG tablet Take 1 tablet (500 mg total) by mouth 4 (four) times daily for 7 days. 06/21/18 06/28/18  Claude Manges, PA-C    Family  History Family History  Problem Relation Age of Onset  . Hypertension Mother   . Lupus Mother   . Diabetes Maternal Grandmother   . Hypertension Maternal Grandmother   . Anuerysm Paternal Grandmother     Social History Social History   Tobacco Use  . Smoking status: Never Smoker  . Smokeless tobacco: Never Used  Substance Use Topics  . Alcohol use: No  . Drug use: No     Allergies   Shellfish allergy   Review of Systems Review of Systems  Constitutional: Negative for fever.  HENT: Positive for dental problem.      Physical Exam Updated Vital Signs BP 104/66 (BP Location: Right Arm)   Pulse 90   Temp 97.8 F (36.6 C) (Oral)   Resp 20   LMP 05/23/2018   SpO2 99%   Physical Exam Vitals signs and nursing note reviewed.  Constitutional:      General: She is not in acute distress.    Appearance: She is well-developed.  HENT:     Head: Normocephalic  and atraumatic.     Mouth/Throat:     Mouth: Mucous membranes are moist.     Pharynx: No oropharyngeal exudate.   Eyes:     Pupils: Pupils are equal, round, and reactive to light.  Neck:     Musculoskeletal: Normal range of motion.  Cardiovascular:     Rate and Rhythm: Regular rhythm.     Heart sounds: Normal heart sounds.  Pulmonary:     Effort: Pulmonary effort is normal. No respiratory distress.     Breath sounds: Normal breath sounds.  Abdominal:     General: Bowel sounds are normal. There is no distension.     Palpations: Abdomen is soft.     Tenderness: There is no abdominal tenderness.  Musculoskeletal:        General: No tenderness or deformity.     Right lower leg: No edema.     Left lower leg: No edema.  Skin:    General: Skin is warm and dry.  Neurological:     Mental Status: She is alert and oriented to person, place, and time.      ED Treatments / Results  Labs (all labs ordered are listed, but only abnormal results are displayed) Labs Reviewed - No data to  display  EKG None  Radiology No results found.  Procedures Procedures (including critical care time)  Medications Ordered in ED Medications - No data to display   Initial Impression / Assessment and Plan / ED Course  I have reviewed the triage vital signs and the nursing notes.  Pertinent labs & imaging results that were available during my care of the patient were reviewed by me and considered in my medical decision making (see chart for details).      Patient with dental pain x a few days. Denies any fever, stable vital sign in the ED. Visible dental abscess on right upper gum line. She is [facility] reports no income for dental care. Will provide her with dental resources along with antibiotics to treat her abscess. Encouraged to follow up with dental care.    Final Clinical Impressions(s) / ED Diagnoses   Final diagnoses:  Dental abscess  Pain, dental    ED Discharge Orders         Ordered    penicillin v potassium (VEETID) 500 MG tablet  4 times daily     06/21/18 0836    ibuprofen (ADVIL,MOTRIN) 600 MG tablet  Every 6 hours PRN     06/21/18 0836           Claude Manges, PA-C 06/21/18 0845    Azalia Bilis, MD 06/22/18 [phone]

## 2018-06-21 NOTE — ED Notes (Signed)
Esignature pad not available for pt to sign for discharge papers. Pt agreeable to discharge and understands her instructions/prescriptions.

## 2018-06-25 ENCOUNTER — Other Ambulatory Visit: Payer: Self-pay | Admitting: Critical Care Medicine

## 2018-06-25 MED ORDER — PENICILLIN V POTASSIUM 500 MG PO TABS: 500.0000 mg | ORAL_TABLET | Freq: Four times a day (QID) | ORAL | 0 refills | Status: AC

## 2018-06-25 MED ORDER — IBUPROFEN 600 MG PO TABS: 600.0000 mg | ORAL_TABLET | Freq: Four times a day (QID) | ORAL | 0 refills | Status: AC | PRN

## 2018-06-25 MED FILL — PENICILLIN VK 500 MG TABLET: 500 | 7 days supply | Qty: 28 | Fill #0

## 2018-06-25 MED FILL — IBUPROFEN 600 MG TABLET: 600 | 8 days supply | Qty: 28 | Fill #0

## 2018-06-25 NOTE — Progress Notes (Signed)
Has tooth abscess.  Sent pcn/ibupr to [facility] as pt has no $$ to buy at private pharmacy   From ED visit 2/27

## 2018-06-27 ENCOUNTER — Encounter: Payer: Self-pay | Admitting: Critical Care Medicine

## 2018-06-27 ENCOUNTER — Ambulatory Visit (HOSPITAL_COMMUNITY)
Admission: EM | Admit: 2018-06-27 | Discharge: 2018-06-27 | Disposition: A | Discharge status: Home / Self Care | Payer: Medicare Other | Attending: Family Medicine | Admitting: Family Medicine

## 2018-06-27 ENCOUNTER — Encounter (HOSPITAL_COMMUNITY): Payer: Self-pay | Admitting: Emergency Medicine

## 2018-06-27 ENCOUNTER — Other Ambulatory Visit: Payer: Self-pay | Admitting: Critical Care Medicine

## 2018-06-27 DIAGNOSIS — Z113 Encounter for screening for infections with a predominantly sexual mode of transmission: Secondary | ICD-10-CM | POA: Diagnosis not present

## 2018-06-27 DIAGNOSIS — T192XXA Foreign body in vulva and vagina, initial encounter: Secondary | ICD-10-CM | POA: Diagnosis not present

## 2018-06-27 DIAGNOSIS — Z862 Personal history of diseases of the blood and blood-forming organs and certain disorders involving the immune mechanism: Secondary | ICD-10-CM | POA: Diagnosis not present

## 2018-06-27 LAB — HEMOGLOBIN AND HEMATOCRIT, BLOOD
HEMATOCRIT: 33.5 % — AB (ref 36.0–46.0)
HEMOGLOBIN: 9 g/dL — AB (ref 12.0–15.0)

## 2018-06-27 MED ORDER — FLUCONAZOLE 150 MG PO TABS: 150.0000 mg | ORAL_TABLET | Freq: Every day | ORAL | 1 refills | Status: DC

## 2018-06-27 MED ORDER — METRONIDAZOLE 0.75 % VA GEL: 1.0000 | Freq: Two times a day (BID) | VAGINAL | 0 refills | Status: DC

## 2018-06-27 MED ORDER — FLUCONAZOLE 150 MG PO TABS
150.0000 mg | ORAL_TABLET | Freq: Every day | ORAL | 1 refills | Status: DC
Start: 2018-06-27 — End: 2018-06-27

## 2018-06-27 NOTE — ED Triage Notes (Signed)
Pt states she has a tampon in her vagina that has been stuck there for 4 days.

## 2018-06-27 NOTE — Progress Notes (Signed)
Refills on vaginal Rx

## 2018-06-27 NOTE — Discharge Instructions (Addendum)
We have sent testing for sexually transmitted infections. We will notify you of any positive results once they are received. If required, we will prescribe any medications you might need.  Please refrain from all sexual activity for at least the next seven days.  

## 2018-06-27 NOTE — ED Provider Notes (Signed)
[facility] CARE CENTER   [id number] 06/27/18 Arrival Time: 1000  ASSESSMENT & PLAN:  1. Vaginal foreign body, initial encounter   2. Screening for STDs (sexually transmitted diseases)   3. History of anemia    Will check her Hg at her request. Has been on Fe pills in the past.  Tampon removed from vagina without complications. No signs of infection or PID. Discussed.   Discharge Instructions     We have sent testing for sexually transmitted infections. We will notify you of any positive results once they are received. If required, we will prescribe any medications you might need.  Please refrain from all sexual activity for at least the next seven days.    For question of yeast infection: Meds ordered this encounter  Medications  . fluconazole (DIFLUCAN) 150 MG tablet    Sig: Take 1 tablet (150 mg total) by mouth daily. May repeat in three days if not improving.    Dispense:  1 tablet    Refill:  1    Pending: Labs Reviewed  HEMOGLOBIN AND HEMATOCRIT, BLOOD  CERVICOVAGINAL ANCILLARY ONLY    Will notify of any positive results. Instructed to refrain from sexual activity for at least seven days.  May f/u here as needed.  Reviewed expectations re: course of current medical issues. Questions answered. Outlined signs and symptoms indicating need for more acute intervention. Patient verbalized understanding. After Visit Summary given.   SUBJECTIVE:  Alexandra Ross is a 33 y.o. female who presents with complaint of possibly having a tampon stuck in her vagina for the past 3-4 days. Menstrual period has ended. No vaginal d/c or pain. Urinary symptoms: none. Afebrile. No abdominal or pelvic pain. No n/v. No rashes or lesions. Sexually active with single female partner but last sexual activity greater than one month ago. Questions if she has a yeast infection. External vaginal itching. Recently completed a course of antibiotics. OTC treatment: none. Also reports h/o anemia.  Desires Hg check.  Patient's last menstrual period was 06/27/2018.  ROS: As per HPI. All other systems negative.  OBJECTIVE:  Vitals:   06/27/18 1103  BP: (!) 101/55  Pulse: 90  Resp: 18  Temp: 98.9 F (37.2 C)  SpO2: 100%    General appearance: alert, cooperative, appears stated age and no distress Throat: lips, mucosa, and tongue normal; teeth and gums normal CV: RRR Lungs: CTAB Back: no CVA tenderness; FROM at waist Abdomen: soft, non-tender GU: no vaginal lesions; white tampon in posterior vagina; no bleeding; no discharge; no cervical tenderness Skin: warm and dry Psychological: alert and cooperative; normal mood and affect.   Labs Reviewed  HEMOGLOBIN AND HEMATOCRIT, BLOOD  CERVICOVAGINAL ANCILLARY ONLY    Allergies  Allergen Reactions  . Shellfish Allergy     Past Medical History:  Diagnosis Date  . Anemia   . Asthma   . Depression    doing ok, never on meds, no official dx  . Headache    Family History  Problem Relation Age of Onset  . Hypertension Mother   . Lupus Mother   . Diabetes Maternal Grandmother   . Hypertension Maternal Grandmother   . Anuerysm Paternal Grandmother    Social History   Socioeconomic History  . Marital status: Single    Spouse name: Not on file  . Number of children: Not on file  . Years of education: Not on file  . Highest education level: Not on file  Occupational History  . Not on file  Social  Needs  . Financial resource strain: Not on file  . Food insecurity:    Worry: Not on file    Inability: Not on file  . Transportation needs:    Medical: Not on file    Non-medical: Not on file  Tobacco Use  . Smoking status: Never Smoker  . Smokeless tobacco: Never Used  Substance and Sexual Activity  . Alcohol use: No  . Drug use: No  . Sexual activity: Not Currently    Birth control/protection: None  Lifestyle  . Physical activity:    Days per week: Not on file    Minutes per session: Not on file  .  Stress: Not on file  Relationships  . Social connections:    Talks on phone: Not on file    Gets together: Not on file    Attends religious service: Not on file    Active member of club or organization: Not on file    Attends meetings of clubs or organizations: Not on file    Relationship status: Not on file  . Intimate partner violence:    Fear of current or ex partner: Not on file    Emotionally abused: Not on file    Physically abused: Not on file    Forced sexual activity: Not on file  Other Topics Concern  . Not on file  Social History Narrative  . Not on file          Mardella Layman, MD 06/27/18 1308

## 2018-06-28 LAB — CERVICOVAGINAL ANCILLARY ONLY
Bacterial vaginitis: POSITIVE — AB
CHLAMYDIA, DNA PROBE: NEGATIVE
Candida vaginitis: NEGATIVE
Neisseria Gonorrhea: NEGATIVE
TRICH (WINDOWPATH): NEGATIVE

## 2018-06-28 NOTE — Progress Notes (Signed)
This is a 33 year old female seen in the [place] [facility] for swelling and pain in the face and upper gum on the right.  Also the patient's had also been seen in the emergency room for a yeast infection and was prescribed vaginal cream and fluconazole.  We were able to provide for the patient to penicillin prescription and ibuprofen for the abscess in the teeth but now she needs medications filled for the vaginal concern.  The patient is still smoking to some degree.  She does have episodes of gas and heartburn.  But there is no vomiting.  There is some nausea.  Her stools occasionally are watery.  She also had a vaginal discharge.  We examined this patient briefly and reviewed the urgent care notes.  She already has the penicillin and ibuprofen prescriptions.  We wrote for her the MetroGel Flagyl vaginal applicator and we also wrote for the fluconazole as prescribed by urgent care through the [facility]

## 2018-07-13 ENCOUNTER — Encounter: Payer: Self-pay | Admitting: Pediatric Intensive Care

## 2018-08-03 NOTE — [facility] (Signed)
  Dept: [phone]   [facility] Note  Date of Encounter: 07/13/2018  Past Medical History: Past Medical History:  Diagnosis Date  . Anemia   . Asthma   . Depression    doing ok, never on meds, no official dx  . Headache     Encounter Details:Cleint seen by DR Delford Field at beginning of month and diagnosed with vaginitis. Client states she continues to have symptoms. She has refill for diflucan at [facility] but does not have funds. CN will fill for client. Shann Medal RN BSN CNP [phone]

## 2018-11-08 ENCOUNTER — Other Ambulatory Visit: Payer: Self-pay

## 2018-11-08 ENCOUNTER — Encounter (HOSPITAL_COMMUNITY): Payer: Self-pay

## 2018-11-08 ENCOUNTER — Ambulatory Visit (HOSPITAL_COMMUNITY)
Admission: EM | Admit: 2018-11-08 | Discharge: 2018-11-08 | Disposition: A | Discharge status: Home / Self Care | Payer: Medicare Other | Attending: Family Medicine | Admitting: Family Medicine

## 2018-11-08 DIAGNOSIS — N898 Other specified noninflammatory disorders of vagina: Secondary | ICD-10-CM | POA: Insufficient documentation

## 2018-11-08 MED ORDER — FLUCONAZOLE 150 MG PO TABS: 150.0000 mg | ORAL_TABLET | Freq: Every day | ORAL | 1 refills | Status: DC

## 2018-11-08 NOTE — Discharge Instructions (Signed)
I did not visualize any foreign body in the vaginal area. Diflucan sent to the pharmacy for yeast infection Swab sent for testing

## 2018-11-08 NOTE — ED Triage Notes (Addendum)
Pt states she may have a tampon stuck. Pt states she has been seeing  tissue and something that could be a tampon when she wipes after using the restroom.. She says she saw this yesterday.

## 2018-11-09 NOTE — ED Provider Notes (Signed)
[facility]    CSN: [id number] Arrival date & time: 11/08/18  1103     History   Chief Complaint Chief Complaint  Patient presents with  . tampon stuck    HPI Coti Asencion PartridgeGalindez is a 33 y.o. female.   Pt is a 33 year old female that presents with possible tampon stuck in the the vagina. She noticed some tissue when she wiped yesterday and couldn't remember if she put a tampon in or not. No pain. Started menstrual cycle yesterday. Light bleeding. Currently sexually active. She has also had some vaginal discharge and itching and irritation. Was treated for yeast infection a few weeks ago but never took the medication. No abd pain, back pain, dysuria, fever, chills.   ROS per HPI      Past Medical History:  Diagnosis Date  . Anemia   . Asthma   . Depression    doing ok, never on meds, no official dx  . Headache     There are no active problems to display for this patient.   Past Surgical History:  Procedure Laterality Date  . CESAREAN SECTION      OB History    Gravida  3   Para  3   Term  3   Preterm      AB      Living  3     SAB      TAB      Ectopic      Multiple      Live Births           Obstetric Comments  First baby was breech         Home Medications    Prior to Admission medications   Medication Sig Start Date End Date Taking? Authorizing Provider  fluconazole (DIFLUCAN) 150 MG tablet Take 1 tablet (150 mg total) by mouth daily. May repeat in three days if not improving. 11/08/18   Dahlia ByesBast, Henrick Mcgue A, NP  metroNIDAZOLE (METROGEL VAGINAL) 0.75 % vaginal gel Place 1 Applicatorful vaginally 2 (two) times daily. 06/27/18   Storm FriskWright, Patrick E, MD    Family History Family History  Problem Relation Age of Onset  . Hypertension Mother   . Lupus Mother   . Diabetes Maternal Grandmother   . Hypertension Maternal Grandmother   . Anuerysm Paternal Grandmother     Social History Social History   Tobacco Use  . Smoking status:  Never Smoker  . Smokeless tobacco: Never Used  Substance Use Topics  . Alcohol use: No  . Drug use: No     Allergies   Shellfish allergy   Review of Systems Review of Systems   Physical Exam Triage Vital Signs ED Triage Vitals  Enc Vitals Group     BP 11/08/18 1138 124/87     Pulse Rate 11/08/18 1138 (!) 110     Resp 11/08/18 1138 16     Temp 11/08/18 1138 98.4 F (36.9 C)     Temp Source 11/08/18 1138 Oral     SpO2 11/08/18 1138 100 %     Weight 11/08/18 1136 135 lb (61.2 kg)     Height --      Head Circumference --      Peak Flow --      Pain Score 11/08/18 1136 10     Pain Loc --      Pain Edu? --      Excl. in GC? --    No data  found.  Updated Vital Signs BP 124/87 (BP Location: Right Arm)   Pulse (!) 110   Temp 98.4 F (36.9 C) (Oral)   Resp 16   Wt 135 lb (61.2 kg)   LMP 11/08/2018   SpO2 100%   Visual Acuity Right Eye Distance:   Left Eye Distance:   Bilateral Distance:    Right Eye Near:   Left Eye Near:    Bilateral Near:     Physical Exam Vitals signs and nursing note reviewed.  Constitutional:      General: She is not in acute distress.    Appearance: Normal appearance. She is not ill-appearing, toxic-appearing or diaphoretic.  HENT:     Head: Normocephalic.     Nose: Nose normal.     Mouth/Throat:     Pharynx: Oropharynx is clear.  Eyes:     Conjunctiva/sclera: Conjunctivae normal.  Neck:     Musculoskeletal: Normal range of motion.  Pulmonary:     Effort: Pulmonary effort is normal.  Abdominal:     Palpations: Abdomen is soft.     Tenderness: There is no abdominal tenderness.  Genitourinary:    Comments: Internal vaginal exam revealed some pieces of tissue, possibly pieces of tampon. No whole tampon visible No bleeding.  White discharge.   External vaginal exam normal.  Musculoskeletal: Normal range of motion.  Skin:    General: Skin is warm and dry.     Findings: No rash.  Neurological:     Mental Status: She is  alert.  Psychiatric:        Mood and Affect: Mood normal.      UC Treatments / Results  Labs (all labs ordered are listed, but only abnormal results are displayed) Labs Reviewed  CERVICOVAGINAL ANCILLARY ONLY    EKG   Radiology No results found.  Procedures Procedures (including critical care time)  Medications Ordered in UC Medications - No data to display  Initial Impression / Assessment and Plan / UC Course  I have reviewed the triage vital signs and the nursing notes.  Pertinent labs & imaging results that were available during my care of the patient were reviewed by me and considered in my medical decision making (see chart for details).     Did not visualize tampon during pelvic exam Some pieces of tissue possibly remanence of a tampon. Removed with fox swab.  Discharge noted and treating for yeast. Swab sent for testing.  Follow up as needed for continued or worsening symptoms   Final Clinical Impressions(s) / UC Diagnoses   Final diagnoses:  Vaginal discharge     Discharge Instructions     I did not visualize any foreign body in the vaginal area. Diflucan sent to the pharmacy for yeast infection Swab sent for testing    ED Prescriptions    Medication Sig Dispense Auth. Provider   fluconazole (DIFLUCAN) 150 MG tablet Take 1 tablet (150 mg total) by mouth daily. May repeat in three days if not improving. 2 tablet Orvan July, NP     Controlled Substance Prescriptions [place] Controlled Substance Registry consulted? no   Orvan July, NP 11/11/18 1054

## 2018-11-11 ENCOUNTER — Encounter (HOSPITAL_COMMUNITY): Payer: Self-pay | Admitting: *Deleted

## 2018-11-11 ENCOUNTER — Ambulatory Visit (HOSPITAL_COMMUNITY)
Admission: EM | Admit: 2018-11-11 | Discharge: 2018-11-11 | Disposition: A | Discharge status: Home / Self Care | Payer: Medicare Other | Attending: Emergency Medicine | Admitting: Emergency Medicine

## 2018-11-11 ENCOUNTER — Other Ambulatory Visit: Payer: Self-pay

## 2018-11-11 DIAGNOSIS — R1030 Lower abdominal pain, unspecified: Secondary | ICD-10-CM | POA: Diagnosis not present

## 2018-11-11 LAB — POCT URINALYSIS DIP (DEVICE)
Bilirubin Urine: NEGATIVE
Glucose, UA: NEGATIVE mg/dL
Hgb urine dipstick: NEGATIVE
Ketones, ur: NEGATIVE mg/dL
Leukocytes,Ua: NEGATIVE
Nitrite: NEGATIVE
Protein, ur: NEGATIVE mg/dL
Specific Gravity, Urine: >=1.03 (ref 1.005–1.030)
Urobilinogen, UA: 0.2 mg/dL (ref 0.0–1.0)
pH: 7 (ref 5.0–8.0)

## 2018-11-11 LAB — POCT PREGNANCY, URINE: Preg Test, Ur: NEGATIVE

## 2018-11-11 NOTE — ED Provider Notes (Signed)
[facility]    CSN: [id number] Arrival date & time: 11/11/18  1002     History   Chief Complaint Chief Complaint  Patient presents with  . Exposure to STD    HPI Alexandra Ross is a 33 y.o. female presenting for acute concern of reviewing lab results.  Patient had been seen here by another provider in 7/16, note was reviewed by me.  Patient states she took her Diflucan, has had resolution of vaginal discharge and irritation.  Patient states that she gets intermittent lower abdominal pain, though feels this is due to eating meat and eggs which are known exacerbate her's of the symptoms.  Patient wanting results of STD check, which are not yet resulted.  Patient wondering if she can have pregnancy test as she has not yet gotten her period.  Currently sexually active with one female partner, not using condoms.    Past Medical History:  Diagnosis Date  . Anemia   . Asthma   . Depression    doing ok, never on meds, no official dx  . Headache     There are no active problems to display for this patient.   Past Surgical History:  Procedure Laterality Date  . CESAREAN SECTION      OB History    Gravida  3   Para  3   Term  3   Preterm      AB      Living  3     SAB      TAB      Ectopic      Multiple      Live Births           Obstetric Comments  First baby was breech         Home Medications    Prior to Admission medications   Medication Sig Start Date End Date Taking? Authorizing Provider  fluconazole (DIFLUCAN) 150 MG tablet Take 1 tablet (150 mg total) by mouth daily. May repeat in three days if not improving. 11/08/18   Loura Halt A, NP  metroNIDAZOLE (METROGEL VAGINAL) 0.75 % vaginal gel Place 1 Applicatorful vaginally 2 (two) times daily. 06/27/18   Elsie Stain, MD    Family History Family History  Problem Relation Age of Onset  . Hypertension Mother   . Lupus Mother   . Diabetes Maternal Grandmother   . Hypertension  Maternal Grandmother   . Anuerysm Paternal Grandmother     Social History Social History   Tobacco Use  . Smoking status: Never Smoker  . Smokeless tobacco: Never Used  Substance Use Topics  . Alcohol use: No  . Drug use: No     Allergies   Shellfish allergy   Review of Systems Review of Systems  Constitutional: Negative for activity change, appetite change, fatigue and fever.  HENT: Negative for ear pain, sinus pain, sore throat and voice change.   Eyes: Negative for pain, redness and visual disturbance.  Respiratory: Negative for cough and shortness of breath.   Cardiovascular: Negative for chest pain and palpitations.  Gastrointestinal: Positive for abdominal pain. Negative for abdominal distention, blood in stool, diarrhea, nausea and vomiting.  Genitourinary: Negative for decreased urine volume, difficulty urinating, dyspareunia, dysuria, flank pain, frequency, hematuria, pelvic pain, urgency, vaginal bleeding, vaginal discharge and vaginal pain.  Musculoskeletal: Negative for arthralgias and myalgias.  Skin: Negative for rash and wound.  Neurological: Negative for syncope and headaches.     Physical Exam Triage  Vital Signs ED Triage Vitals  Enc Vitals Group     BP      Pulse      Resp      Temp      Temp src      SpO2      Weight      Height      Head Circumference      Peak Flow      Pain Score      Pain Loc      Pain Edu?      Excl. in GC?    No data found.  Updated Vital Signs BP 118/73 (BP Location: Left Arm)   Pulse 92   Temp 98.2 F (36.8 C) (Oral)   Resp 16   LMP 11/08/2018   SpO2 100%   Visual Acuity Right Eye Distance:   Left Eye Distance:   Bilateral Distance:    Right Eye Near:   Left Eye Near:    Bilateral Near:     Physical Exam Constitutional:      General: She is not in acute distress. HENT:     Head: Normocephalic and atraumatic.  Eyes:     General: No scleral icterus.    Pupils: Pupils are equal, round, and  reactive to light.  Cardiovascular:     Rate and Rhythm: Normal rate.  Pulmonary:     Effort: Pulmonary effort is normal.  Abdominal:     General: Abdomen is flat. Bowel sounds are normal.     Palpations: Abdomen is soft.     Tenderness: There is no abdominal tenderness. There is no right CVA tenderness, left CVA tenderness or guarding.  Skin:    Coloration: Skin is not jaundiced or pale.  Neurological:     Mental Status: She is alert and oriented to person, place, and time.      UC Treatments / Results  Labs (all labs ordered are listed, but only abnormal results are displayed) Labs Reviewed  POC URINE PREG, ED  POCT URINALYSIS DIP (DEVICE)  POCT PREGNANCY, URINE    EKG   Radiology No results found.  Procedures Procedures (including critical care time)  Medications Ordered in UC Medications - No data to display  Initial Impression / Assessment and Plan / UC Course  I have reviewed the triage vital signs and the nursing notes.  Pertinent labs & imaging results that were available during my care of the patient were reviewed by me and considered in my medical decision making (see chart for details).     1.  Lower abdominal pain Urine pregnancy negative, urinalysis without leukocytes, nitrates.  Cervical vaginal swab still pending.  Work note provided, urinalysis report printed per patient's request.  Reviewed estimated timeframe for STD results.  Will call when available per patient's request patient would like these printed for her to bring work.  Return precautions discussed, patient verbalized understanding and is agreeable to plan. Final Clinical Impressions(s) / UC Diagnoses   Final diagnoses:  Lower abdominal pain     Discharge Instructions     We will call you when the results of your STD panel is back. You can then request to have them printed and pick up in office. You do not need to be seen by provider to review these results.    ED Prescriptions     None     Controlled Substance Prescriptions [place] Controlled Substance Registry consulted? Not Applicable   Shea EvansHall-Potvin, , [place] Jersey[place]-C 11/11/18 1117

## 2018-11-11 NOTE — ED Triage Notes (Signed)
Pt was seen in [facility] 11/08/18; states she needs her STD results so she can return to work.  Notified pt multiple times that her results are still pending, but that we are happy to provide her with a return to work note.  Pt continually keeps stating she needs her test results so she can notify work that she is negative for everything.  Informed pt repeatedly that her medical info is private information, and she can have a note stating she is ok to return to work.  Pt continues to ask for test results, and asked if we could take her previous results and put today's date on them; informed that would be illegal.  When asked if she simply needs a note, pt states she wishes to see a provider.  Despite multiple attempts to explain to pt that it takes at least a few days to have final results, and the reasons why, pt continually states she needs her results "fast; is there any way I can go somewhere to get fast results".

## 2018-11-11 NOTE — Discharge Instructions (Addendum)
We will call you when the results of your STD panel is back. You can then request to have them printed and pick up in office. You do not need to be seen by provider to review these results.

## 2018-11-12 LAB — CERVICOVAGINAL ANCILLARY ONLY
Bacterial vaginitis: NEGATIVE
Candida vaginitis: POSITIVE — AB
Chlamydia: NEGATIVE
Neisseria Gonorrhea: NEGATIVE
Trichomonas: NEGATIVE

## 2020-08-23 DIAGNOSIS — N939 Abnormal uterine and vaginal bleeding, unspecified: Secondary | ICD-10-CM | POA: Diagnosis not present

## 2020-08-23 DIAGNOSIS — D649 Anemia, unspecified: Secondary | ICD-10-CM | POA: Diagnosis not present

## 2020-09-23 ENCOUNTER — Other Ambulatory Visit: Payer: Self-pay

## 2020-09-23 ENCOUNTER — Emergency Department (HOSPITAL_COMMUNITY)
Admission: EM | Admit: 2020-09-23 | Discharge: 2020-09-23 | Disposition: A | Discharge status: Home / Self Care | Payer: Medicare Other | Attending: Emergency Medicine | Admitting: Emergency Medicine

## 2020-09-23 DIAGNOSIS — R102 Pelvic and perineal pain: Secondary | ICD-10-CM | POA: Insufficient documentation

## 2020-09-23 DIAGNOSIS — Z5321 Procedure and treatment not carried out due to patient leaving prior to being seen by health care provider: Secondary | ICD-10-CM | POA: Insufficient documentation

## 2020-09-23 DIAGNOSIS — N939 Abnormal uterine and vaginal bleeding, unspecified: Secondary | ICD-10-CM

## 2020-09-23 LAB — HIV ANTIBODY (ROUTINE TESTING W REFLEX): HIV Screen 4th Generation wRfx: NONREACTIVE

## 2020-09-23 LAB — I-STAT CHEM 8, ED
BUN: 10 mg/dL (ref 6–20)
Calcium, Ion: 1.3 mmol/L (ref 1.15–1.40)
Chloride: 104 mmol/L (ref 98–111)
Creatinine, Ser: 0.5 mg/dL (ref 0.44–1.00)
Glucose, Bld: 105 mg/dL — ABNORMAL HIGH (ref 70–99)
HCT: 38 % (ref 36.0–46.0)
Hemoglobin: 12.9 g/dL (ref 12.0–15.0)
Potassium: 4.2 mmol/L (ref 3.5–5.1)
Sodium: 139 mmol/L (ref 135–145)
TCO2: 24 mmol/L (ref 22–32)

## 2020-09-23 LAB — I-STAT BETA HCG BLOOD, ED (MC, WL, AP ONLY): I-stat hCG, quantitative: <5 m[IU]/mL (ref <5)

## 2020-09-23 NOTE — ED Provider Notes (Signed)
Emergency Medicine Provider Triage Evaluation Note  Alexandra Ross , a 35 y.o. female  was evaluated in triage.  Pt complains of vaginal bleeding.  Review of Systems  Positive: Pelvic pain, vaginal bleeding, vaginal discharge Negative: Fever, dysuria  Physical Exam  BP 116/70 (BP Location: Left Arm)   Pulse 87   Temp 98.4 F (36.9 C) (Oral)   Resp 18   LMP 09/23/2020   SpO2 100%  Gen:   Awake, no distress   Resp:  Normal effort  MSK:   Moves extremities without difficulty  Other:    Medical Decision Making  Medically screening exam initiated at 12:23 PM.  Appropriate orders placed.  Alexandra Ross was informed that the remainder of the evaluation will be completed by another provider, this initial triage assessment does not replace that evaluation, and the importance of remaining in the ED until their evaluation is complete.  Intermittent vaginal bleeding x 2 weeks, endorse some vaginal discharge as well.     Fayrene Helper, PA-C 09/23/20 1227    Milagros Loll, MD 09/24/20 [phone]

## 2020-09-23 NOTE — ED Triage Notes (Signed)
Pt reports period cramps and bleeding x 2 weeks. Seen for same and has been told to see GYN but appointment isn't until July.

## 2020-09-23 NOTE — ED Notes (Signed)
Pt stated pt had to go to work and was expecting a faster treatment. Pt armband removed. Pt witness leaving.

## 2020-09-24 ENCOUNTER — Emergency Department (HOSPITAL_COMMUNITY)
Admission: EM | Admit: 2020-09-24 | Discharge: 2020-09-24 | Disposition: A | Discharge status: Home / Self Care | Payer: Medicare Other | Attending: Emergency Medicine | Admitting: Emergency Medicine

## 2020-09-24 ENCOUNTER — Encounter (HOSPITAL_COMMUNITY): Payer: Self-pay | Admitting: Emergency Medicine

## 2020-09-24 ENCOUNTER — Emergency Department (HOSPITAL_COMMUNITY): Payer: Medicare Other

## 2020-09-24 DIAGNOSIS — R102 Pelvic and perineal pain: Secondary | ICD-10-CM | POA: Diagnosis not present

## 2020-09-24 DIAGNOSIS — B9689 Other specified bacterial agents as the cause of diseases classified elsewhere: Secondary | ICD-10-CM | POA: Insufficient documentation

## 2020-09-24 DIAGNOSIS — J45909 Unspecified asthma, uncomplicated: Secondary | ICD-10-CM | POA: Diagnosis not present

## 2020-09-24 DIAGNOSIS — N939 Abnormal uterine and vaginal bleeding, unspecified: Secondary | ICD-10-CM | POA: Diagnosis not present

## 2020-09-24 DIAGNOSIS — N760 Acute vaginitis: Secondary | ICD-10-CM | POA: Diagnosis not present

## 2020-09-24 LAB — CBC WITH DIFFERENTIAL/PLATELET
Abs Immature Granulocytes: 0.02 10*3/uL (ref 0.00–0.07)
Basophils Absolute: 0 10*3/uL (ref 0.0–0.1)
Basophils Relative: 1 %
Eosinophils Absolute: 0.3 10*3/uL (ref 0.0–0.5)
Eosinophils Relative: 3 %
HCT: 36.7 % (ref 36.0–46.0)
Hemoglobin: 10.8 g/dL — ABNORMAL LOW (ref 12.0–15.0)
Immature Granulocytes: 0 %
Lymphocytes Relative: 27 %
Lymphs Abs: 2.1 10*3/uL (ref 0.7–4.0)
MCH: 21.8 pg — ABNORMAL LOW (ref 26.0–34.0)
MCHC: 29.4 g/dL — ABNORMAL LOW (ref 30.0–36.0)
MCV: 74.1 fL — ABNORMAL LOW (ref 80.0–100.0)
Monocytes Absolute: 0.7 10*3/uL (ref 0.1–1.0)
Monocytes Relative: 9 %
Neutro Abs: 4.8 10*3/uL (ref 1.7–7.7)
Neutrophils Relative %: 60 %
Platelets: 323 10*3/uL (ref 150–400)
RBC: 4.95 MIL/uL (ref 3.87–5.11)
RDW: 16.8 % — ABNORMAL HIGH (ref 11.5–15.5)
WBC: 7.9 10*3/uL (ref 4.0–10.5)
nRBC: 0 % (ref 0.0–0.2)

## 2020-09-24 LAB — WET PREP, GENITAL
Sperm: NONE SEEN
Trich, Wet Prep: NONE SEEN
Yeast Wet Prep HPF POC: NONE SEEN

## 2020-09-24 LAB — RPR: RPR Ser Ql: NONREACTIVE

## 2020-09-24 LAB — BASIC METABOLIC PANEL
Anion gap: 6 (ref 5–15)
BUN: 9 mg/dL (ref 6–20)
CO2: 27 mmol/L (ref 22–32)
Calcium: 9.7 mg/dL (ref 8.9–10.3)
Chloride: 104 mmol/L (ref 98–111)
Creatinine, Ser: 0.76 mg/dL (ref 0.44–1.00)
GFR, Estimated: >60 mL/min (ref >60)
Glucose, Bld: 91 mg/dL (ref 70–99)
Potassium: 3.6 mmol/L (ref 3.5–5.1)
Sodium: 137 mmol/L (ref 135–145)

## 2020-09-24 LAB — I-STAT BETA HCG BLOOD, ED (MC, WL, AP ONLY): I-stat hCG, quantitative: <5 m[IU]/mL (ref <5)

## 2020-09-24 MED ORDER — NAPROXEN 375 MG PO TABS: 375.0000 mg | ORAL_TABLET | Freq: Two times a day (BID) | ORAL | 0 refills | Status: DC

## 2020-09-24 MED ORDER — METRONIDAZOLE 500 MG PO TABS: 500.0000 mg | ORAL_TABLET | Freq: Two times a day (BID) | ORAL | 0 refills | Status: DC

## 2020-09-24 NOTE — ED Notes (Signed)
Patient Alert and oriented to baseline. Stable and ambulatory to baseline. Patient verbalized understanding of the discharge instructions.  Patient belongings were taken by the patient.   

## 2020-09-24 NOTE — Discharge Instructions (Signed)
Take naproxen for pain.   Take Flagyl as directed.  It is very important that you do not consume any alcohol while taking this medication as it will cause you to become violently ill.  As we discussed, your blood work looked reassuring today.  Your ultrasound showed some endometrial thickness which can sometimes cause abnormal bleeding.  This needs to be evaluated by OB/GYN.  I have given you referral.  You have STD cultures pending.  These will take about 2 days to come back.  You will be notified if they are positive.  Return to the emergency department for any worsening vaginal bleeding, abdominal pain, vomiting, lightheadedness or any other worsening concerning symptoms.

## 2020-09-24 NOTE — ED Notes (Signed)
Pt returned from US

## 2020-09-24 NOTE — ED Triage Notes (Signed)
Patient complains of vaginal bleeding and cramping that started approximately two weeks ago. Patient alert, oriented, ambulatory ,and in no apparent distress at this time.

## 2020-09-24 NOTE — ED Provider Notes (Signed)
Emergency Medicine Provider Triage Evaluation Note  Alexandra Ross , a 35 y.o. female  was evaluated in triage.  Pt complains of pelvic pain, vaginal discharge and intermittent vaginal bleeding for the past several weeks.  She was screened yesterday but did not stay for treatment or further evaluation due to needing to be at work.  Review of Systems  Positive: Pelvic pain, vaginal bleeding, vaginal discharge Negative: Vomiting  Physical Exam  BP 127/64   Pulse 94   Temp 98.8 F (37.1 C) (Oral)   Resp 18   LMP 09/23/2020   SpO2 100%  Gen:   Awake, no distress   Resp:  Normal effort  MSK:   Moves extremities without difficulty  Other:  Abdomen is soft  Medical Decision Making  Medically screening exam initiated at 1:41 PM.  Appropriate orders placed.  Oleda Gonterman was informed that the remainder of the evaluation will be completed by another provider, this initial triage assessment does not replace that evaluation, and the importance of remaining in the ED until their evaluation is complete.  Pregnancy test negative yesterday will need pelvic   Dietrich Pates, PA-C 09/24/20 1342    Margarita Grizzle, MD 09/28/20 1348

## 2020-09-24 NOTE — ED Notes (Signed)
Patient transported to Ultrasound 

## 2020-09-24 NOTE — ED Provider Notes (Signed)
[facility] EMERGENCY DEPARTMENT Provider Note   CSN: [id number] Arrival date & time: 09/24/20  1324     History Chief Complaint  Patient presents with  . Vaginal Bleeding    Alexandra Ross is a 35 y.o. female with PMH/o asthma, anemia, Depression, who presents for evaluation of pelvic pain and vaginal bleeding.  She states that this is been ongoing for about 2 weeks.  She states that she has bleeding every day and sometimes will go through 10-20 pads a day and other times it will be more spotting and she goes through her last period she states that she has had intermittent issues with vaginal bleeding previously.  She states that her periods are irregular and sometimes she will have episodes of bleeding multiple times a month.  She was evaluated for this in [place] a few weeks ago but was told she needed to see an OB/GYN.  Patient states she was given "some pills to stop the bleeding" which she states she has been taking.  Patient waited till she came back down here.  She has not seen any OB/GYN yet.  She came to emergency department because she states it is continuing to happen.  She also reports pelvic pain/cramping.  She is not currently sexually active.  She tells me she is not having any vaginal discharge.  Denies any fevers, chest pain, difficulty breathing, nausea/vomiting.  The history is provided by the patient.       Past Medical History:  Diagnosis Date  . Anemia   . Asthma   . Depression    doing ok, never on meds, no official dx  . Headache     There are no problems to display for this patient.   Past Surgical History:  Procedure Laterality Date  . CESAREAN SECTION       OB History    Gravida  3   Para  3   Term  3   Preterm      AB      Living  3     SAB      IAB      Ectopic      Multiple      Live Births           Obstetric Comments  First baby was breech        Family History  Problem Relation Age of Onset   . Hypertension Mother   . Lupus Mother   . Diabetes Maternal Grandmother   . Hypertension Maternal Grandmother   . Anuerysm Paternal Grandmother     Social History   Tobacco Use  . Smoking status: Never Smoker  . Smokeless tobacco: Never Used  Substance Use Topics  . Alcohol use: No  . Drug use: No    Home Medications Prior to Admission medications   Medication Sig Start Date End Date Taking? Authorizing Provider  acetaminophen (TYLENOL) 500 MG tablet Take 500 mg by mouth every 6 (six) hours as needed for moderate pain.   Yes [provider]  ibuprofen (ADVIL) 600 MG tablet Take 600 mg by mouth every 6 (six) hours as needed for mild pain. 08/27/20  Yes [provider]  metroNIDAZOLE (FLAGYL) 500 MG tablet Take 1 tablet (500 mg total) by mouth 2 (two) times daily. 09/24/20  Yes Maxwell Caul, PA-C  naproxen (NAPROSYN) 375 MG tablet Take 1 tablet (375 mg total) by mouth 2 (two) times daily. 09/24/20  Yes Maxwell Caul, PA-C  Allergies    Shellfish allergy  Review of Systems   Review of Systems  Constitutional: Negative for fever.  Respiratory: Negative for shortness of breath.   Cardiovascular: Negative for chest pain.  Gastrointestinal: Negative for abdominal pain, nausea and vomiting.  Genitourinary: Positive for pelvic pain and vaginal bleeding. Negative for dysuria and hematuria.  All other systems reviewed and are negative.   Physical Exam Updated Vital Signs BP 126/81   Pulse 77   Temp 97.9 F (36.6 C) (Oral)   Resp 18   LMP 09/23/2020   SpO2 100%   Physical Exam Vitals and nursing note reviewed.  Constitutional:      Appearance: Normal appearance. She is well-developed.  HENT:     Head: Normocephalic and atraumatic.  Eyes:     General: Lids are normal.     Conjunctiva/sclera: Conjunctivae normal.     Pupils: Pupils are equal, round, and reactive to light.  Cardiovascular:     Rate and Rhythm: Normal rate and regular rhythm.      Pulses: Normal pulses.     Heart sounds: Normal heart sounds. No murmur heard. No friction rub. No gallop.   Pulmonary:     Effort: Pulmonary effort is normal.     Breath sounds: Normal breath sounds.  Abdominal:     Palpations: Abdomen is soft. Abdomen is not rigid.     Tenderness: There is no abdominal tenderness. There is no guarding.     Comments: Abdomen is soft, non-distended, non-tender. No rigidity, No guarding. No peritoneal signs.  Genitourinary:    Comments: The exam was performed with a chaperone present (Jonna, Tch). Normal female genitalia. No evidence of rash, ulcers or lesions.  No vaginal bleeding noted.  Small amount of white discharge noted.  No cervical friability, erythema.  No CMT.  Adnexal tenderness noted bilaterally.  No adnexal mass. Musculoskeletal:        General: Normal range of motion.     Cervical back: Full passive range of motion without pain.  Skin:    General: Skin is warm and dry.     Capillary Refill: Capillary refill takes less than 2 seconds.  Neurological:     Mental Status: She is alert and oriented to person, place, and time.  Psychiatric:        Speech: Speech normal.     ED Results / Procedures / Treatments   Labs (all labs ordered are listed, but only abnormal results are displayed) Labs Reviewed  WET PREP, GENITAL - Abnormal; Notable for the following components:      Result Value   Clue Cells Wet Prep HPF POC PRESENT (*)    WBC, Wet Prep HPF POC MODERATE (*)    All other components within normal limits  CBC WITH DIFFERENTIAL/PLATELET - Abnormal; Notable for the following components:   Hemoglobin 10.8 (*)    MCV 74.1 (*)    MCH 21.8 (*)    MCHC 29.4 (*)    RDW 16.8 (*)    All other components within normal limits  BASIC METABOLIC PANEL  I-STAT BETA HCG BLOOD, ED ([facility], [facility], [facility] ONLY)  GC/CHLAMYDIA PROBE AMP ([place]) NOT AT [facility]    EKG None  Radiology US Transvaginal Non-OB  Result Date: 09/24/2020 CLINICAL DATA:  Pelvic  pain in a 35 year old female with negative pregnancy test, last menstrual period of 09/18/2020. EXAM: TRANSABDOMINAL AND TRANSVAGINAL ULTRASOUND OF PELVIS DOPPLER ULTRASOUND OF OVARIES TECHNIQUE: Both transabdominal and transvaginal ultrasound examinations of the pelvis were  performed. Transabdominal technique was performed for global imaging of the pelvis including uterus, ovaries, adnexal regions, and pelvic cul-de-sac. It was necessary to proceed with endovaginal exam following the transabdominal exam to visualize the LEFT ovary. Color and duplex Doppler ultrasound was utilized to evaluate blood flow to the ovaries. COMPARISON:  None FINDINGS: Uterus Measurements: 6.7 x 4.3 x 5.8 cm = volume: 87.1 mL. No fibroids or other mass visualized. Endometrium Thickness: 8 mm.  No focal abnormality visualized. Right ovary Measurements: 3.2 x 1.9 x 1.9 cm = volume: 6 mL. Normal appearance/no adnexal mass. Left ovary Measurements: 2.0 x 1.1 x 2.1 cm = volume: 2.4 mL. Normal appearance/no adnexal mass. Pulsed Doppler evaluation of both ovaries demonstrates normal low-resistance arterial and venous waveforms. Other findings Trace free fluid in the cul-de-sac.  Appears simple. LEFT parametrial varices. IMPRESSION: No acute pelvic findings. No signs of ovarian torsion. 8 mm endometrium without focal abnormality. If bleeding remains unresponsive to hormonal or medical therapy, sonohysterogram should be considered for focal lesion work-up. (Ref: Radiological Reasoning: Algorithmic Workup of Abnormal Vaginal Bleeding with Endovaginal Sonography and Sonohysterography. AJR 2008; 161:W96-04; 191:S68-73) Electronically Signed   By: Donzetta KohutGeoffrey  Wile M.D.   On: 09/24/2020 18:13   US Pelvis Complete  Result Date: 09/24/2020 CLINICAL DATA:  Pelvic pain in a 35 year old female with negative pregnancy test, last menstrual period of 09/18/2020. EXAM: TRANSABDOMINAL AND TRANSVAGINAL ULTRASOUND OF PELVIS DOPPLER ULTRASOUND OF OVARIES TECHNIQUE: Both  transabdominal and transvaginal ultrasound examinations of the pelvis were performed. Transabdominal technique was performed for global imaging of the pelvis including uterus, ovaries, adnexal regions, and pelvic cul-de-sac. It was necessary to proceed with endovaginal exam following the transabdominal exam to visualize the LEFT ovary. Color and duplex Doppler ultrasound was utilized to evaluate blood flow to the ovaries. COMPARISON:  None FINDINGS: Uterus Measurements: 6.7 x 4.3 x 5.8 cm = volume: 87.1 mL. No fibroids or other mass visualized. Endometrium Thickness: 8 mm.  No focal abnormality visualized. Right ovary Measurements: 3.2 x 1.9 x 1.9 cm = volume: 6 mL. Normal appearance/no adnexal mass. Left ovary Measurements: 2.0 x 1.1 x 2.1 cm = volume: 2.4 mL. Normal appearance/no adnexal mass. Pulsed Doppler evaluation of both ovaries demonstrates normal low-resistance arterial and venous waveforms. Other findings Trace free fluid in the cul-de-sac.  Appears simple. LEFT parametrial varices. IMPRESSION: No acute pelvic findings. No signs of ovarian torsion. 8 mm endometrium without focal abnormality. If bleeding remains unresponsive to hormonal or medical therapy, sonohysterogram should be considered for focal lesion work-up. (Ref: Radiological Reasoning: Algorithmic Workup of Abnormal Vaginal Bleeding with Endovaginal Sonography and Sonohysterography. AJR 2008; 540:J81-19; 191:S68-73) Electronically Signed   By: Donzetta KohutGeoffrey  Wile M.D.   On: 09/24/2020 18:13   US Art/Ven Flow Abd Pelv Doppler  Result Date: 09/24/2020 CLINICAL DATA:  Pelvic pain in a [age] year old female with negative pregnancy test, last menstrual period of 09/18/2020. EXAM: TRANSABDOMINAL AND TRANSVAGINAL ULTRASOUND OF PELVIS DOPPLER ULTRASOUND OF OVARIES TECHNIQUE: Both transabdominal and transvaginal ultrasound examinations of the pelvis were performed. Transabdominal technique was performed for global imaging of the pelvis including uterus, ovaries, adnexal  regions, and pelvic cul-de-sac. It was necessary to proceed with endovaginal exam following the transabdominal exam to visualize the LEFT ovary. Color and duplex Doppler ultrasound was utilized to evaluate blood flow to the ovaries. COMPARISON:  None FINDINGS: Uterus Measurements: 6.7 x 4.3 x 5.8 cm = volume: 87.1 mL. No fibroids or other mass visualized. Endometrium Thickness: 8 mm.  No focal abnormality visualized. Right ovary Measurements: 3.2  x 1.9 x 1.9 cm = volume: 6 mL. Normal appearance/no adnexal mass. Left ovary Measurements: 2.0 x 1.1 x 2.1 cm = volume: 2.4 mL. Normal appearance/no adnexal mass. Pulsed Doppler evaluation of both ovaries demonstrates normal low-resistance arterial and venous waveforms. Other findings Trace free fluid in the cul-de-sac.  Appears simple. LEFT parametrial varices. IMPRESSION: No acute pelvic findings. No signs of ovarian torsion. 8 mm endometrium without focal abnormality. If bleeding remains unresponsive to hormonal or medical therapy, sonohysterogram should be considered for focal lesion work-up. (Ref: Radiological Reasoning: Algorithmic Workup of Abnormal Vaginal Bleeding with Endovaginal Sonography and Sonohysterography. AJR 2008; 174:Y81-44) Electronically Signed   By: Donzetta Kohut M.D.   On: 09/24/2020 18:13    Procedures Procedures   Medications Ordered in ED Medications - No data to display  ED Course  I have reviewed the triage vital signs and the nursing notes.  Pertinent labs & imaging results that were available during my care of the patient were reviewed by me and considered in my medical decision making (see chart for details).    MDM Rules/Calculators/A&P                          35 year old female who presents for evaluation of pelvic pain, vaginal bleeding has been ongoing for last few weeks.  Reports she has had an intermittent issues with vaginal bleeding.  Has not seen OB/GYN.  States she is not having any vaginal discharge, is not  currently sexually active but would like to be tested for STDs.  She denies any fevers, nausea/vomiting.  Plan to check labs, urine, pelvic.  Pelvic exam and document above.  No CMT that would be concerning for PID.  She did have some bilateral adnexal tenderness.  Plan to evaluate with ultrasound.  Wet prep shows positive clue cells.  I-STAT beta negative.  CBC shows no leukocytosis.  Hemoglobin is 10.8.  Review of her records show that she has been anemic previously.  Ultrasound shows 8 mm endometrium without focal abnormality.  Ovaries unremarkable bilaterally.  Discussed results with patient.  We will plan to treat patient for BV.  At this time, patient is hemodynamically stable.  Patient instructed that she needs to follow-up with OB/GYN regarding the ultrasound findings today.  We will give her OB/GYN referral.  Additionally, discussed with her that if her bleeding worsens, she feels lightheaded like she is going to pass out, she needs to return the emergency department. At this time, patient exhibits no emergent life-threatening condition that require further evaluation in ED. Patient had ample opportunity for questions and discussion. All patient's questions were answered with full understanding. Strict return precautions discussed. Patient expresses understanding and agreement to plan.   Portions of this note were generated with Scientist, clinical (histocompatibility and immunogenetics). Dictation errors may occur despite best attempts at proofreading.  Final Clinical Impression(s) / ED Diagnoses Final diagnoses:  Abnormal uterine bleeding (AUB)  BV (bacterial vaginosis)    Rx / DC Orders ED Discharge Orders         Ordered    naproxen (NAPROSYN) 375 MG tablet  2 times daily        09/24/20 1833    metroNIDAZOLE (FLAGYL) 500 MG tablet  2 times daily        09/24/20 1833           Rosana Hoes 09/24/20 2225    Jacalyn Lefevre, MD 09/24/20 2333

## 2020-09-25 LAB — GC/CHLAMYDIA PROBE AMP (~~LOC~~) NOT AT ARMC
Chlamydia: NEGATIVE
Comment: NEGATIVE
Comment: NORMAL
Neisseria Gonorrhea: NEGATIVE

## 2020-10-09 ENCOUNTER — Ambulatory Visit (HOSPITAL_COMMUNITY)
Admission: EM | Admit: 2020-10-09 | Discharge: 2020-10-09 | Disposition: A | Discharge status: Home / Self Care | Payer: Medicare Other | Attending: Medical Oncology | Admitting: Medical Oncology

## 2020-10-09 ENCOUNTER — Other Ambulatory Visit: Payer: Self-pay

## 2020-10-09 ENCOUNTER — Encounter (HOSPITAL_COMMUNITY): Payer: Self-pay

## 2020-10-09 DIAGNOSIS — R102 Pelvic and perineal pain: Secondary | ICD-10-CM | POA: Diagnosis not present

## 2020-10-09 DIAGNOSIS — N939 Abnormal uterine and vaginal bleeding, unspecified: Secondary | ICD-10-CM | POA: Diagnosis not present

## 2020-10-09 DIAGNOSIS — N898 Other specified noninflammatory disorders of vagina: Secondary | ICD-10-CM | POA: Diagnosis not present

## 2020-10-09 LAB — POCT URINALYSIS DIPSTICK, ED / UC
Bilirubin Urine: NEGATIVE
Glucose, UA: NEGATIVE mg/dL
Leukocytes,Ua: NEGATIVE
Nitrite: NEGATIVE
Protein, ur: NEGATIVE mg/dL
Specific Gravity, Urine: >=1.03 (ref 1.005–1.030)
Urobilinogen, UA: 0.2 mg/dL (ref 0.0–1.0)
pH: 6 (ref 5.0–8.0)

## 2020-10-09 LAB — POC URINE PREG, ED: Preg Test, Ur: NEGATIVE

## 2020-10-09 LAB — CBC
HCT: 38.4 % (ref 36.0–46.0)
Hemoglobin: 11.2 g/dL — ABNORMAL LOW (ref 12.0–15.0)
MCH: 21.7 pg — ABNORMAL LOW (ref 26.0–34.0)
MCHC: 29.2 g/dL — ABNORMAL LOW (ref 30.0–36.0)
MCV: 74.6 fL — ABNORMAL LOW (ref 80.0–100.0)
Platelets: 342 10*3/uL (ref 150–400)
RBC: 5.15 MIL/uL — ABNORMAL HIGH (ref 3.87–5.11)
RDW: 17.4 % — ABNORMAL HIGH (ref 11.5–15.5)
WBC: 7 10*3/uL (ref 4.0–10.5)
nRBC: 0 % (ref 0.0–0.2)

## 2020-10-09 NOTE — ED Provider Notes (Signed)
[facility]    CSN: [id number] Arrival date & time: 10/09/20  1411      History   Chief Complaint Chief Complaint  Patient presents with   Vaginal Bleeding    HPI Alexandra Ross is a 35 y.o. female.   HPI  Vaginal Bleeding: Pt reports that for the past few weeks she has had vaginal bleeding. In addition she has had some abdominal and pelvic pain.  She has been seen by GYN and also the ED for this.  On 09/24/2020 her hemoglobin level was 10.8 in the emergency room.  At this time she had white blood cells and clue cells.  She did not have any CMT at this time.  She was treated with naproxen and Flagyl for symptoms.  Since this time patient reports that symptoms did not fully resolve. She states that she was not given the right antibiotics and that her family members told her that she need to be treated with doxycycline.   Past Medical History:  Diagnosis Date   Anemia    Asthma    Depression    doing ok, never on meds, no official dx   Headache     There are no problems to display for this patient.   Past Surgical History:  Procedure Laterality Date   CESAREAN SECTION      OB History     Gravida  3   Para  3   Term  3   Preterm      AB      Living  3      SAB      IAB      Ectopic      Multiple      Live Births           Obstetric Comments  First baby was breech          Home Medications    Prior to Admission medications   Medication Sig Start Date End Date Taking? Authorizing Provider  acetaminophen (TYLENOL) 500 MG tablet Take 500 mg by mouth every 6 (six) hours as needed for moderate pain.    [provider]  ibuprofen (ADVIL) 600 MG tablet Take 600 mg by mouth every 6 (six) hours as needed for mild pain. 08/27/20   [provider]  metroNIDAZOLE (FLAGYL) 500 MG tablet Take 1 tablet (500 mg total) by mouth 2 (two) times daily. 09/24/20   Maxwell Caul, PA-C  naproxen (NAPROSYN) 375 MG tablet Take 1 tablet  (375 mg total) by mouth 2 (two) times daily. 09/24/20   Maxwell Caul, PA-C    Family History Family History  Problem Relation Age of Onset   Hypertension Mother    Lupus Mother    Diabetes Maternal Grandmother    Hypertension Maternal Grandmother    Anuerysm Paternal Grandmother     Social History Social History   Tobacco Use   Smoking status: Never   Smokeless tobacco: Never  Substance Use Topics   Alcohol use: No   Drug use: No     Allergies   Shellfish allergy   Review of Systems Review of Systems  As stated above in HPI Physical Exam Triage Vital Signs ED Triage Vitals  Enc Vitals Group     BP 10/09/20 1509 119/78     Pulse Rate 10/09/20 1509 92     Resp 10/09/20 1509 17     Temp 10/09/20 1512 98.4 F (36.9 C)  Temp Source 10/09/20 1509 Oral     SpO2 10/09/20 1509 100 %     Weight --      Height --      Head Circumference --      Peak Flow --      Pain Score 10/09/20 1511 9     Pain Loc --      Pain Edu? --      Excl. in GC? --    No data found.  Updated Vital Signs BP 119/78 (BP Location: Left Arm)   Pulse 92   Temp 98.4 F (36.9 C) (Oral)   Resp 17   LMP 09/23/2020   SpO2 100%   Physical Exam Vitals and nursing note reviewed.  Constitutional:      General: She is not in acute distress.    Appearance: Normal appearance. She is not ill-appearing, toxic-appearing or diaphoretic.  HENT:     Head: Normocephalic and atraumatic.  Cardiovascular:     Rate and Rhythm: Normal rate and regular rhythm.     Heart sounds: Normal heart sounds.  Pulmonary:     Effort: Pulmonary effort is normal.     Breath sounds: Normal breath sounds.  Abdominal:     General: Abdomen is flat. Bowel sounds are normal. There is no distension.     Palpations: Abdomen is soft. There is no mass.     Tenderness: There is no abdominal tenderness. There is no right CVA tenderness, left CVA tenderness, guarding or rebound.     Hernia: No hernia is present.   Genitourinary:    Comments: Pt performs self swab collection Neurological:     Mental Status: She is alert.     UC Treatments / Results  Labs (all labs ordered are listed, but only abnormal results are displayed) Labs Reviewed - No data to display  EKG   Radiology No results found.  Procedures Procedures (including critical care time)  Medications Ordered in UC Medications - No data to display  Initial Impression / Assessment and Plan / UC Course  I have reviewed the triage vital signs and the nursing notes.  Pertinent labs & imaging results that were available during my care of the patient were reviewed by me and considered in my medical decision making (see chart for details).     New.  Labs today.  I discussed with patient that upon review of her ER note it appears that she had bacterial vaginosis and that she was treated appropriately with Flagyl.  She would like rescreening with additional STIs which I am agreeable with so we are going to have her perform this today.  In addition we have additional labs pending including a CBC.  Her urine pregnancy test is negative and her urine dipstick does not show significant signs of any infection or abnormality other than mild dehydration and mild hemoglobin which could be secondary to dehydration as well.   Final Clinical Impressions(s) / UC Diagnoses   Final diagnoses:  None   Discharge Instructions   None    ED Prescriptions   None    PDMP not reviewed this encounter.   Rushie Chestnut, [place] 10/09/20 1707

## 2020-10-09 NOTE — ED Triage Notes (Signed)
Pt in with c/o abnormal vaginal bleeding that has been going on for a few weeks  States she was seen by gynecologist and in the ED  Pt also c/o back and abdominal pain

## 2020-10-11 ENCOUNTER — Other Ambulatory Visit: Payer: Self-pay

## 2020-10-11 ENCOUNTER — Emergency Department (HOSPITAL_COMMUNITY)
Admission: EM | Admit: 2020-10-11 | Discharge: 2020-10-11 | Disposition: A | Discharge status: Home / Self Care | Payer: Medicare Other | Attending: Emergency Medicine | Admitting: Emergency Medicine

## 2020-10-11 ENCOUNTER — Emergency Department (HOSPITAL_COMMUNITY): Payer: Medicare Other

## 2020-10-11 DIAGNOSIS — Y990 Civilian activity done for income or pay: Secondary | ICD-10-CM | POA: Insufficient documentation

## 2020-10-11 DIAGNOSIS — W010XXA Fall on same level from slipping, tripping and stumbling without subsequent striking against object, initial encounter: Secondary | ICD-10-CM | POA: Diagnosis not present

## 2020-10-11 DIAGNOSIS — J45909 Unspecified asthma, uncomplicated: Secondary | ICD-10-CM | POA: Insufficient documentation

## 2020-10-11 DIAGNOSIS — Y93E5 Activity, floor mopping and cleaning: Secondary | ICD-10-CM | POA: Diagnosis not present

## 2020-10-11 DIAGNOSIS — M79671 Pain in right foot: Secondary | ICD-10-CM | POA: Diagnosis not present

## 2020-10-11 DIAGNOSIS — M25571 Pain in right ankle and joints of right foot: Secondary | ICD-10-CM | POA: Diagnosis not present

## 2020-10-11 NOTE — ED Notes (Signed)
RN paged ortho °

## 2020-10-11 NOTE — ED Triage Notes (Signed)
PT came for right ankle pain. Pt states she fell this morning at work.

## 2020-10-11 NOTE — ED Provider Notes (Signed)
[facility] EMERGENCY DEPARTMENT Provider Note   CSN: [id number] Arrival date & time: 10/11/20  1200     History Chief Complaint  Patient presents with   Ankle Pain    Alexandra Ross is a 35 y.o. female who presents to the ED today with complaint of sudden onset, constant, achy, R ankle pain s/p fall that occurred earlier today while at work. Pt states someone had just mopped the floors. She went to get a soda when she slipped on the wet floor and twisted her ankle in the process. She denies head injury or LOC. Has not taken anything for the pain. Has been limping s/2 pain since then. No other complaints at this time.   The history is provided by the patient and medical records.      Past Medical History:  Diagnosis Date   Anemia    Asthma    Depression    doing ok, never on meds, no official dx   Headache     There are no problems to display for this patient.   Past Surgical History:  Procedure Laterality Date   CESAREAN SECTION       OB History     Gravida  3   Para  3   Term  3   Preterm      AB      Living  3      SAB      IAB      Ectopic      Multiple      Live Births           Obstetric Comments  First baby was breech         Family History  Problem Relation Age of Onset   Hypertension Mother    Lupus Mother    Diabetes Maternal Grandmother    Hypertension Maternal Grandmother    Anuerysm Paternal Grandmother     Social History   Tobacco Use   Smoking status: Never   Smokeless tobacco: Never  Substance Use Topics   Alcohol use: No   Drug use: No    Home Medications Prior to Admission medications   Medication Sig Start Date End Date Taking? Authorizing Provider  acetaminophen (TYLENOL) 500 MG tablet Take 500 mg by mouth every 6 (six) hours as needed for moderate pain.    [provider]  ibuprofen (ADVIL) 600 MG tablet Take 600 mg by mouth every 6 (six) hours as needed for mild pain. 08/27/20    [provider]  metroNIDAZOLE (FLAGYL) 500 MG tablet Take 1 tablet (500 mg total) by mouth 2 (two) times daily. 09/24/20   Maxwell Caul, PA-C  naproxen (NAPROSYN) 375 MG tablet Take 1 tablet (375 mg total) by mouth 2 (two) times daily. 09/24/20   Maxwell Caul, PA-C    Allergies    Shellfish allergy  Review of Systems   Review of Systems  Constitutional:  Negative for chills and fever.  Musculoskeletal:  Positive for arthralgias.  Neurological:  Negative for syncope and headaches.  All other systems reviewed and are negative.  Physical Exam Updated Vital Signs BP 124/82 (BP Location: Right Arm)   Pulse 94   Temp 97.9 F (36.6 C) (Oral)   Resp 12   LMP 09/23/2020   SpO2 100%   Physical Exam Vitals and nursing note reviewed.  Constitutional:      Appearance: She is not ill-appearing or diaphoretic.  HENT:  Head: Normocephalic and atraumatic.  Eyes:     Conjunctiva/sclera: Conjunctivae normal.  Cardiovascular:     Rate and Rhythm: Normal rate and regular rhythm.  Pulmonary:     Effort: Pulmonary effort is normal.     Breath sounds: Normal breath sounds.  Musculoskeletal:     Comments: No obvious swelling noted to the R ankle or foot. No wounds. + TTP to the lateral malleolus and proximal 5th metatarsal. ROM intact to ankle and toes. Sensation intact. Cap refill < 2 seconds. 2+ DP pulse.   Skin:    General: Skin is warm and dry.     Coloration: Skin is not jaundiced.  Neurological:     Mental Status: She is alert.    ED Results / Procedures / Treatments   Labs (all labs ordered are listed, but only abnormal results are displayed) Labs Reviewed - No data to display  EKG None  Radiology DG Ankle Complete Right  Result Date: 10/11/2020 CLINICAL DATA:  Fall.  Pain EXAM: RIGHT ANKLE - COMPLETE 3+ VIEW COMPARISON:  None. FINDINGS: There is no evidence of fracture, dislocation, or joint effusion. There is no evidence of arthropathy or other focal bone  abnormality. Soft tissues are unremarkable. IMPRESSION: Negative. Electronically Signed   By: Marlan Palau M.D.   On: 10/11/2020 13:47   DG Foot Complete Right  Result Date: 10/11/2020 CLINICAL DATA:  Fall.  Pain EXAM: RIGHT FOOT COMPLETE - 3+ VIEW COMPARISON:  None. FINDINGS: There is no evidence of fracture or dislocation. There is no evidence of arthropathy or other focal bone abnormality. Soft tissues are unremarkable. IMPRESSION: Negative. Electronically Signed   By: Marlan Palau M.D.   On: 10/11/2020 13:47    Procedures Procedures   Medications Ordered in ED Medications - No data to display  ED Course  I have reviewed the triage vital signs and the nursing notes.  Pertinent labs & imaging results that were available during my care of the patient were reviewed by me and considered in my medical decision making (see chart for details).    MDM Rules/Calculators/A&P                          35 year old female presenting to the ED today with complaints of R ankle pain s/p inversion injury at work this AM. On arrival to the ED VSS. Pt appears to be in NAD. Found speaking on the phone loudly as I enter the room. On exam pt has mild TTP to the lateral malleolus and 5th metatarsal proximally. She is neurovascularly intact. Will plan for xrays at this time.   Xray negative at this time. Air brace and crutches provided for symptomatic relief. RICE therapy and Ibuprofen/Tylenol for pain. Pt stable for discharge.   This note was prepared using Dragon voice recognition software and may include unintentional dictation errors due to the inherent limitations of voice recognition software.  Final Clinical Impression(s) / ED Diagnoses Final diagnoses:  Acute right ankle pain    Rx / DC Orders ED Discharge Orders     None        Discharge Instructions      Your xray did not show any signs of fractures or dislocations. Your pain is likely related to a soft tissue injury/sprain at  this time.   Please use brace and crutches as needed. While at home please rest, ice, and elevate your ankle to help reduce pain/swelling. You can take Ibuprofen  and Tylenol as needed for pain.   Follow up with your PCP regarding your ED visit. If you do not have one you can follow up with [facility] for primary care needs.        Tanda Rockers, PA-C 10/11/20 1413    Koleen Distance, MD 10/11/20 [phone]

## 2020-10-11 NOTE — Progress Notes (Signed)
Orthopedic Tech Progress Note Patient Details:  Alexandra Ross 01/04/1986 [id number]   Ortho Devices Type of Ortho Device: Ankle Air splint, Crutches Ortho Device/Splint Location: RLE Ortho Device/Splint Interventions: Ordered, Adjustment, Application   Post Interventions Patient Tolerated: Well Instructions Provided: Poper ambulation with device, Care of device, Adjustment of device  Ted Leonhart Carmine Savoy 10/11/2020, 3:36 PM

## 2020-10-11 NOTE — Discharge Instructions (Addendum)
Your xray did not show any signs of fractures or dislocations. Your pain is likely related to a soft tissue injury/sprain at this time.   Please use brace and crutches as needed. While at home please rest, ice, and elevate your ankle to help reduce pain/swelling. You can take Ibuprofen and Tylenol as needed for pain.   Follow up with your PCP regarding your ED visit. If you do not have one you can follow up with [facility] for primary care needs.

## 2020-10-11 NOTE — ED Notes (Signed)
Pt did not want crutches.

## 2020-10-12 LAB — CERVICOVAGINAL ANCILLARY ONLY
Bacterial Vaginitis (gardnerella): NEGATIVE
Candida Glabrata: NEGATIVE
Candida Vaginitis: NEGATIVE
Chlamydia: NEGATIVE
Comment: NEGATIVE
Comment: NEGATIVE
Comment: NEGATIVE
Comment: NEGATIVE
Comment: NEGATIVE
Comment: NORMAL
Neisseria Gonorrhea: NEGATIVE
Trichomonas: NEGATIVE

## 2020-10-29 ENCOUNTER — Encounter: Payer: Medicare Other | Admitting: Women's Health

## 2021-03-23 ENCOUNTER — Other Ambulatory Visit: Payer: Self-pay

## 2021-03-23 ENCOUNTER — Emergency Department (HOSPITAL_COMMUNITY)
Admission: EM | Admit: 2021-03-23 | Discharge: 2021-03-24 | Discharge status: Against Medical Advice | Payer: Medicare Other

## 2021-03-23 ENCOUNTER — Encounter (HOSPITAL_COMMUNITY): Payer: Self-pay

## 2021-03-23 ENCOUNTER — Ambulatory Visit (HOSPITAL_COMMUNITY)
Admission: EM | Admit: 2021-03-23 | Discharge: 2021-03-23 | Disposition: A | Discharge status: Home / Self Care | Payer: Medicare Other | Attending: Family Medicine | Admitting: Family Medicine

## 2021-03-23 DIAGNOSIS — K0889 Other specified disorders of teeth and supporting structures: Secondary | ICD-10-CM

## 2021-03-23 DIAGNOSIS — K047 Periapical abscess without sinus: Secondary | ICD-10-CM | POA: Diagnosis not present

## 2021-03-23 MED ORDER — LIDOCAINE VISCOUS HCL 2 % MT SOLN: 10.0000 mL | OROMUCOSAL | 0 refills | Status: AC | PRN

## 2021-03-23 MED ORDER — AMOXICILLIN-POT CLAVULANATE 875-125 MG PO TABS: 1.0000 | ORAL_TABLET | Freq: Two times a day (BID) | ORAL | 0 refills | Status: DC

## 2021-03-23 MED ORDER — CHLORHEXIDINE GLUCONATE 0.12 % MT SOLN: 15.0000 mL | Freq: Two times a day (BID) | OROMUCOSAL | 0 refills | Status: DC

## 2021-03-23 NOTE — ED Notes (Signed)
Pt called twice for triage ,no answer.

## 2021-03-23 NOTE — ED Triage Notes (Signed)
Pt presents with c/o dental pain x 3 days.   Pt requests results of her past STD testing.

## 2021-03-23 NOTE — ED Notes (Signed)
Pt called for triage, no answer

## 2021-03-23 NOTE — ED Provider Notes (Signed)
[facility]    CSN: [id number] Arrival date & time: 03/23/21  0900      History   Chief Complaint Chief Complaint  Patient presents with   Dental Pain    HPI Alexandra Ross is a 35 y.o. female.   Presenting today with several day history of right lower dental pain, swelling now with facial swelling in this area.  Denies fever, chills, dysphagia, severe headache.  Known dental issue in this area and has had recurrent infections, does not currently have a dentist.  Trying over-the-counter pain relievers with no relief.   Past Medical History:  Diagnosis Date   Anemia    Asthma    Depression    doing ok, never on meds, no official dx   Headache     There are no problems to display for this patient.   Past Surgical History:  Procedure Laterality Date   CESAREAN SECTION      OB History     Gravida  3   Para  3   Term  3   Preterm      AB      Living  3      SAB      IAB      Ectopic      Multiple      Live Births           Obstetric Comments  First baby was breech          Home Medications    Prior to Admission medications   Medication Sig Start Date End Date Taking? Authorizing Provider  amoxicillin-clavulanate (AUGMENTIN) 875-125 MG tablet Take 1 tablet by mouth every 12 (twelve) hours. 03/23/21  Yes Particia Nearing, PA-C  chlorhexidine (PERIDEX) 0.12 % solution Use as directed 15 mLs in the mouth or throat 2 (two) times daily. 03/23/21  Yes Particia Nearing, PA-C  lidocaine (XYLOCAINE) 2 % solution Use as directed 10 mLs in the mouth or throat every 3 (three) hours as needed for mouth pain. 03/23/21  Yes Particia Nearing, PA-C  acetaminophen (TYLENOL) 500 MG tablet Take 500 mg by mouth every 6 (six) hours as needed for moderate pain.    [provider]  ibuprofen (ADVIL) 600 MG tablet Take 600 mg by mouth every 6 (six) hours as needed for mild pain. 08/27/20   [provider]  metroNIDAZOLE  (FLAGYL) 500 MG tablet Take 1 tablet (500 mg total) by mouth 2 (two) times daily. 09/24/20   Maxwell Caul, PA-C  naproxen (NAPROSYN) 375 MG tablet Take 1 tablet (375 mg total) by mouth 2 (two) times daily. 09/24/20   Maxwell Caul, PA-C    Family History Family History  Problem Relation Age of Onset   Hypertension Mother    Lupus Mother    Diabetes Maternal Grandmother    Hypertension Maternal Grandmother    Anuerysm Paternal Grandmother     Social History Social History   Tobacco Use   Smoking status: Never   Smokeless tobacco: Never  Substance Use Topics   Alcohol use: No   Drug use: No     Allergies   Shellfish allergy   Review of Systems Review of Systems Per HPI  Physical Exam Triage Vital Signs ED Triage Vitals  Enc Vitals Group     BP 03/23/21 1139 117/70     Pulse Rate 03/23/21 1139 83     Resp 03/23/21 1139 19     Temp 03/23/21  1139 98.1 F (36.7 C)     Temp Source 03/23/21 1139 Oral     SpO2 03/23/21 1139 100 %     Weight --      Height --      Head Circumference --      Peak Flow --      Pain Score 03/23/21 1138 6     Pain Loc --      Pain Edu? --      Excl. in GC? --    No data found.  Updated Vital Signs BP 117/70 (BP Location: Left Arm)   Pulse 83   Temp 98.1 F (36.7 C) (Oral)   Resp 19   LMP 02/15/2021 (Approximate)   SpO2 100%   Visual Acuity Right Eye Distance:   Left Eye Distance:   Bilateral Distance:    Right Eye Near:   Left Eye Near:    Bilateral Near:     Physical Exam Vitals and nursing note reviewed.  Constitutional:      Appearance: Normal appearance. She is not ill-appearing.  HENT:     Head: Atraumatic.     Mouth/Throat:     Mouth: Mucous membranes are moist.     Pharynx: Oropharynx is clear. Posterior oropharyngeal erythema present.     Comments: Significant gingival erythema, edema right posterior molar region, poor dentition diffusely, facial swelling in this region externally Eyes:      Extraocular Movements: Extraocular movements intact.     Conjunctiva/sclera: Conjunctivae normal.  Cardiovascular:     Rate and Rhythm: Normal rate and regular rhythm.     Heart sounds: Normal heart sounds.  Pulmonary:     Effort: Pulmonary effort is normal.     Breath sounds: Normal breath sounds.  Musculoskeletal:        General: Normal range of motion.     Cervical back: Normal range of motion and neck supple.  Skin:    General: Skin is warm and dry.  Neurological:     Mental Status: She is alert and oriented to person, place, and time.  Psychiatric:        Mood and Affect: Mood normal.        Thought Content: Thought content normal.        Judgment: Judgment normal.     UC Treatments / Results  Labs (all labs ordered are listed, but only abnormal results are displayed) Labs Reviewed - No data to display  EKG   Radiology No results found.  Procedures Procedures (including critical care time)  Medications Ordered in UC Medications - No data to display  Initial Impression / Assessment and Plan / UC Course  I have reviewed the triage vital signs and the nursing notes.  Pertinent labs & imaging results that were available during my care of the patient were reviewed by me and considered in my medical decision making (see chart for details).     Treat with Augmentin, viscous lidocaine, Peridex rinse.  Close follow-up with dentist soon as possible.  She is requesting a note with physical signature to show to the Social Security office that she was seen today.  This note was generated.  Final Clinical Impressions(s) / UC Diagnoses   Final diagnoses:  Pain, dental  Dental infection   Discharge Instructions   None    ED Prescriptions     Medication Sig Dispense Auth. Provider   amoxicillin-clavulanate (AUGMENTIN) 875-125 MG tablet Take 1 tablet by mouth every 12 (twelve) hours. 14 tablet  Volney American, PA-C   lidocaine (XYLOCAINE) 2 % solution Use as  directed 10 mLs in the mouth or throat every 3 (three) hours as needed for mouth pain. 100 mL Volney American, PA-C   chlorhexidine (PERIDEX) 0.12 % solution Use as directed 15 mLs in the mouth or throat 2 (two) times daily. 120 mL Volney American, [place]      PDMP not reviewed this encounter.   Volney American, [place] 03/23/21 1230

## 2021-07-08 ENCOUNTER — Ambulatory Visit (HOSPITAL_COMMUNITY)
Admission: EM | Admit: 2021-07-08 | Discharge: 2021-07-08 | Disposition: A | Discharge status: Home / Self Care | Payer: Medicaid Other | Attending: Family Medicine | Admitting: Family Medicine

## 2021-07-08 ENCOUNTER — Encounter (HOSPITAL_COMMUNITY): Payer: Self-pay | Admitting: Emergency Medicine

## 2021-07-08 ENCOUNTER — Other Ambulatory Visit: Payer: Self-pay

## 2021-07-08 DIAGNOSIS — K0889 Other specified disorders of teeth and supporting structures: Secondary | ICD-10-CM | POA: Diagnosis not present

## 2021-07-08 MED ORDER — AMOXICILLIN 875 MG PO TABS: 875.0000 mg | ORAL_TABLET | Freq: Two times a day (BID) | ORAL | 0 refills | Status: AC

## 2021-07-08 MED ORDER — IBUPROFEN 800 MG PO TABS: 800.0000 mg | ORAL_TABLET | Freq: Three times a day (TID) | ORAL | 0 refills | Status: DC

## 2021-07-08 NOTE — ED Triage Notes (Signed)
Pt reports dental pain x 4 days. Pt states she was seen before for same and the infection has not gone away.  ?

## 2021-07-08 NOTE — ED Provider Notes (Signed)
?  [place] ? ? ?[id number] ?07/08/21 Arrival Time: 1101 ? ?ASSESSMENT & PLAN: ? ?1. Pain, dental   ? ?No sign of abscess requiring I&D at this time. Discussed. ? ?Meds ordered this encounter  ?Medications  ? amoxicillin (AMOXIL) 875 MG tablet  ?  Sig: Take 1 tablet (875 mg total) by mouth 2 (two) times daily for 10 days.  ?  Dispense:  20 tablet  ?  Refill:  0  ? ibuprofen (ADVIL) 800 MG tablet  ?  Sig: Take 1 tablet (800 mg total) by mouth 3 (three) times daily with meals.  ?  Dispense:  21 tablet  ?  Refill:  0  ? ?Has dentist. ? ?Reviewed expectations re: course of current medical issues. Questions answered. ?Outlined signs and symptoms indicating need for more acute intervention. ?Patient verbalized understanding. ?After Visit Summary given. ? ? ?SUBJECTIVE: ? ?Alexandra Ross is a 36 y.o. female who reports gradual onset of right lower dental pain described as aching. Present for several days. Fever: absent. Tolerating PO intake but reports pain with chewing. Normal swallowing. She does see a dentist. No neck swelling or pain. OTC analgesics without relief. ? ?OBJECTIVE: ?Vitals:  ? 07/08/21 1202 07/08/21 1203  ?BP:  95/66  ?Pulse:  80  ?Resp:  17  ?Temp:  98 ?F (36.7 ?C)  ?TempSrc:  Oral  ?SpO2:  98%  ?Weight: 61.2 kg   ?  ?General appearance: alert; no distress ?HENT: normocephalic; atraumatic; dentition: poor with multiple caries; right lower gum without areas of fluctuance, drainage, or bleeding and with tenderness to palpation; normal jaw movement without difficulty ?Neck: supple without LAD; FROM; trachea midline ?Lungs: normal respirations; unlabored; speaks full sentences without difficulty ?Skin: warm and dry ?Psychological: alert and cooperative; normal mood and affect ? ?Allergies  ?Allergen Reactions  ? Shellfish Allergy   ? ? ?Past Medical History:  ?Diagnosis Date  ? Anemia   ? Asthma   ? Depression   ? doing ok, never on meds, no official dx  ? Headache   ? ?Social History   ? ?Socioeconomic History  ? Marital status: Single  ?  Spouse name: Not on file  ? Number of children: Not on file  ? Years of education: Not on file  ? Highest education level: Not on file  ?Occupational History  ? Not on file  ?Tobacco Use  ? Smoking status: Never  ? Smokeless tobacco: Never  ?Substance and Sexual Activity  ? Alcohol use: No  ? Drug use: No  ? Sexual activity: Not Currently  ?  Birth control/protection: None  ?Other Topics Concern  ? Not on file  ?Social History Narrative  ? Not on file  ? ?Social Determinants of Health  ? ?Financial Resource Strain: Not on file  ?Food Insecurity: Not on file  ?Transportation Needs: Not on file  ?Physical Activity: Not on file  ?Stress: Not on file  ?Social Connections: Not on file  ?Intimate Partner Violence: Not on file  ? ?Family History  ?Problem Relation Age of Onset  ? Hypertension Mother   ? Lupus Mother   ? Diabetes Maternal Grandmother   ? Hypertension Maternal Grandmother   ? Anuerysm Paternal Grandmother   ? ?Past Surgical History:  ?Procedure Laterality Date  ? CESAREAN SECTION    ? ? ?  ?Vanessa Kick, MD ?07/08/21 1317 ? ?

## 2021-07-29 IMAGING — US US PELVIS COMPLETE
1 series · 13 of 25 positions shown · non-contrast
Comparison: None

CLINICAL DATA: Pelvic pain in a 34-year-old female with negative
pregnancy test, last menstrual period of 09/18/2020.

EXAM:
TRANSABDOMINAL AND TRANSVAGINAL ULTRASOUND OF PELVIS
DOPPLER ULTRASOUND OF OVARIES
TECHNIQUE: Both transabdominal and transvaginal ultrasound examinations of the
pelvis were performed. Transabdominal technique was performed for
global imaging of the pelvis including uterus, ovaries, adnexal
regions, and pelvic cul-de-sac.
It was necessary to proceed with endovaginal exam following the
transabdominal exam to visualize the LEFT ovary. Color and duplex
Doppler ultrasound was utilized to evaluate blood flow to the
ovaries.

[Series 1: us pelvis (transabdominal only) · 13 of 77 slices shown]
[im 1/77]
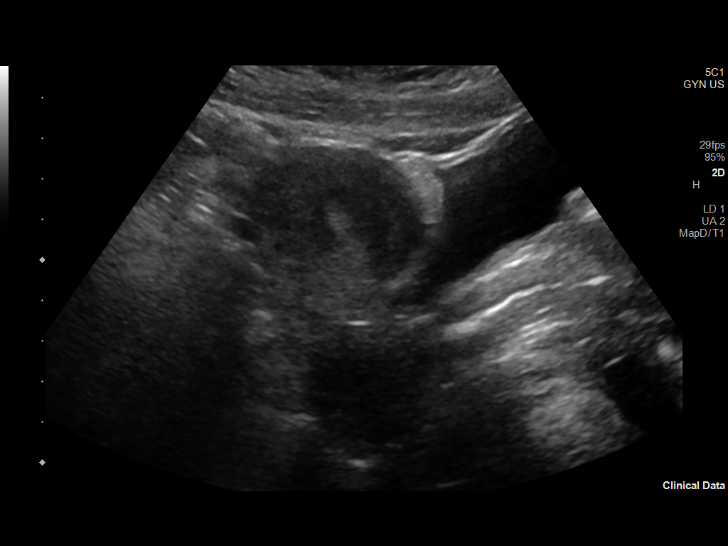
[im 7/77]
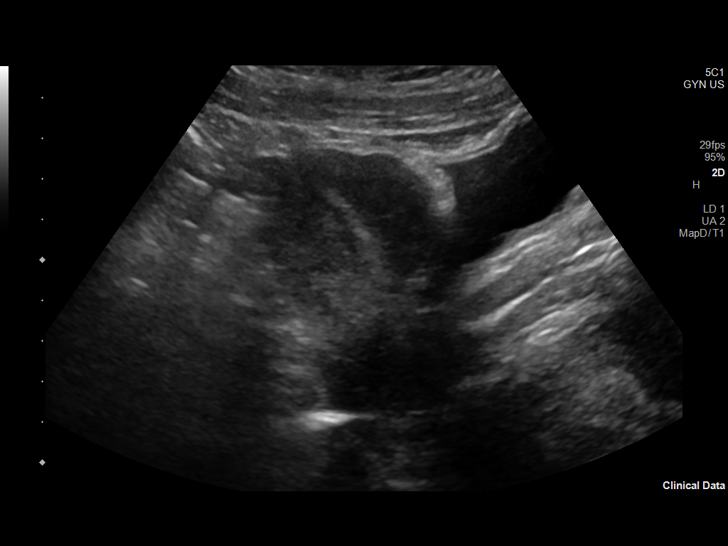
[im 13/77]
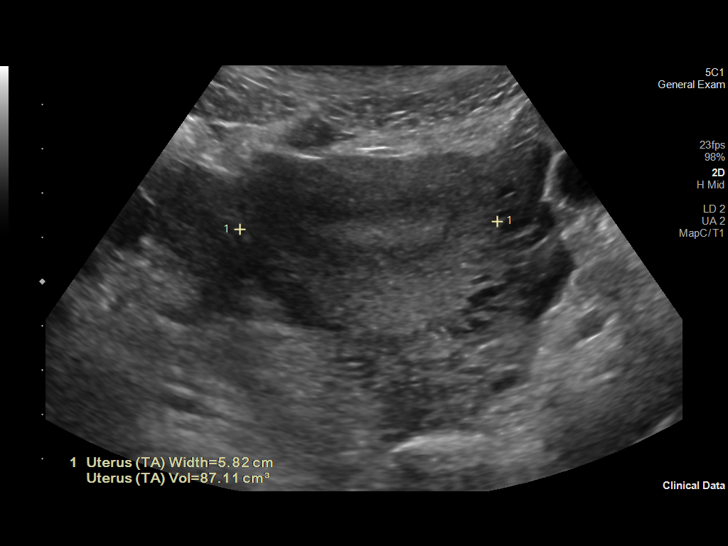
[im 20/77]
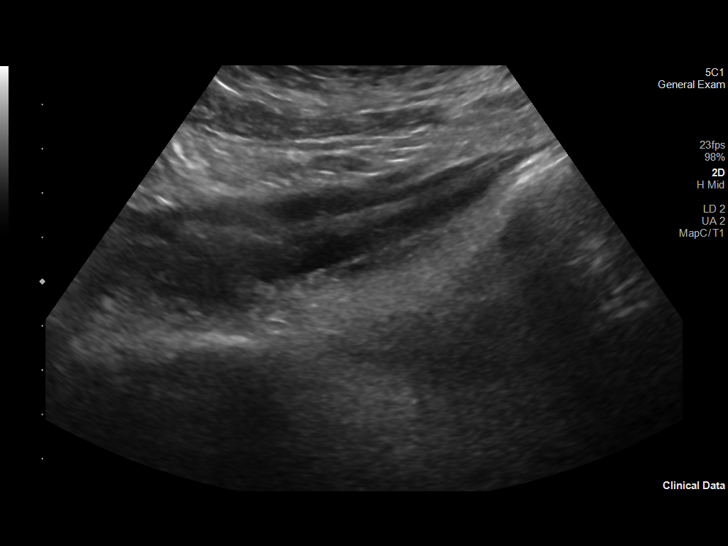
[im 26/77]
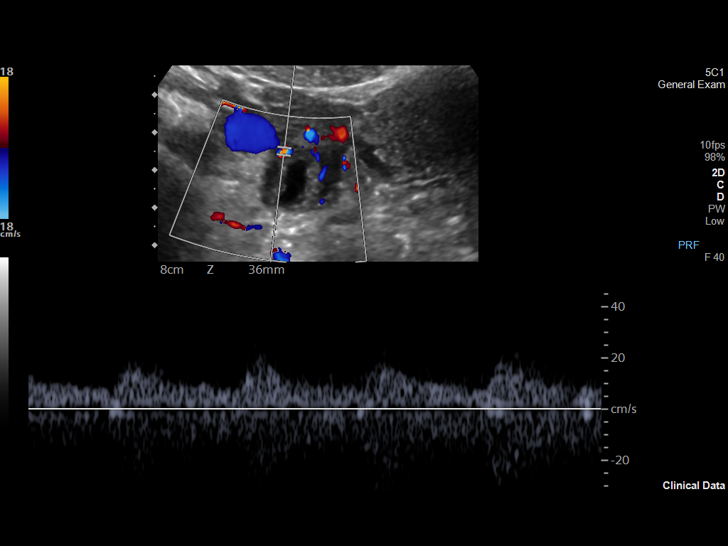
[im 32/77]
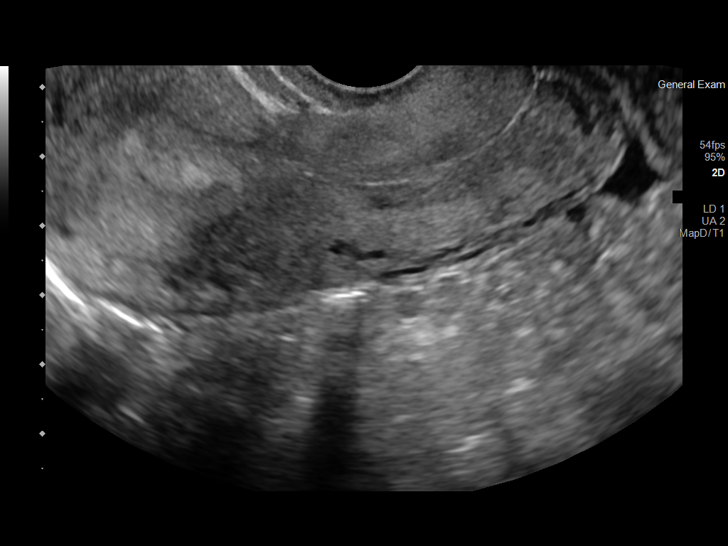
[im 39/77]
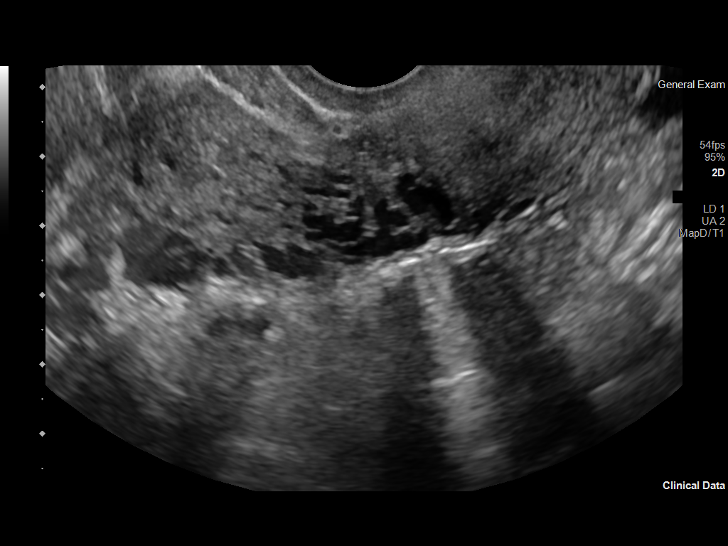
[im 45/77]
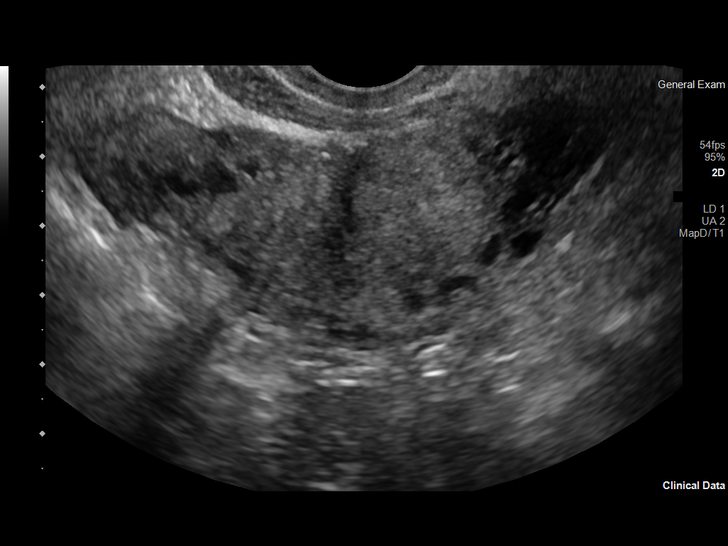
[im 51/77]
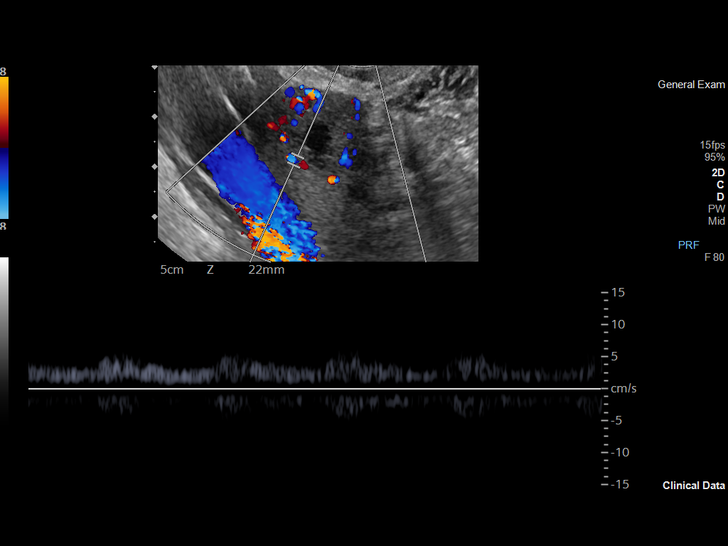
[im 58/77]
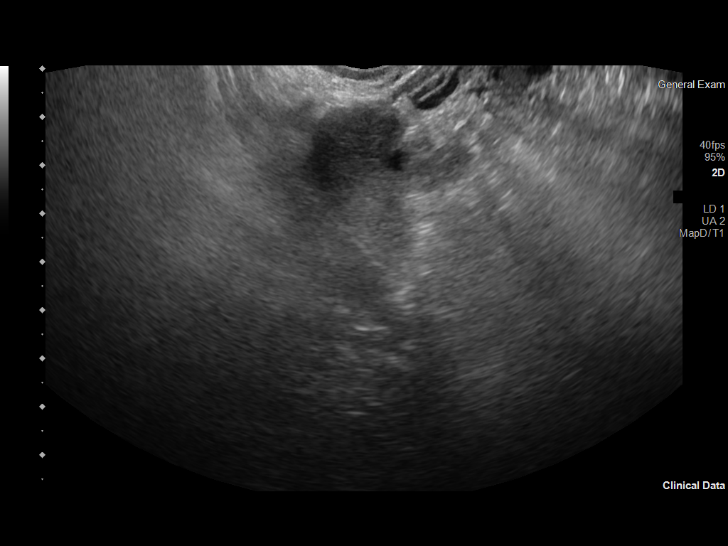
[im 64/77]
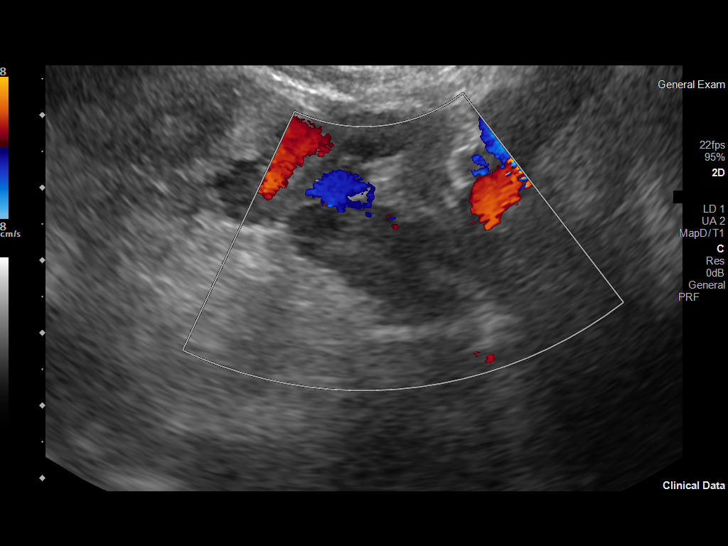
[im 70/77]
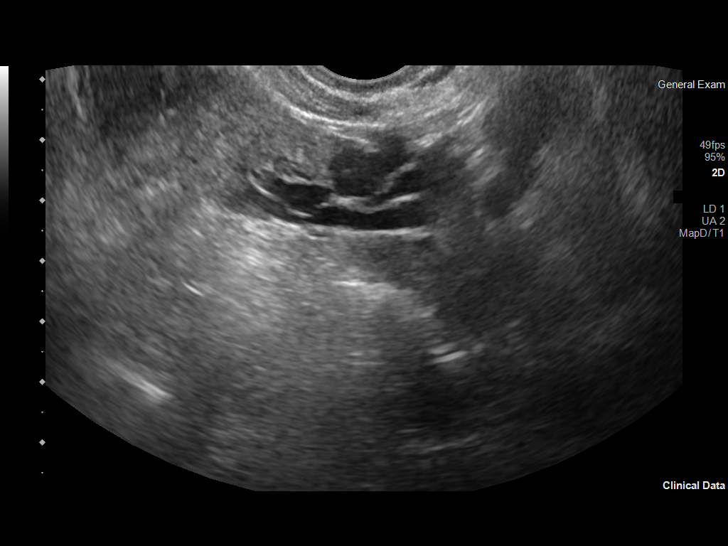
[im 77/77]
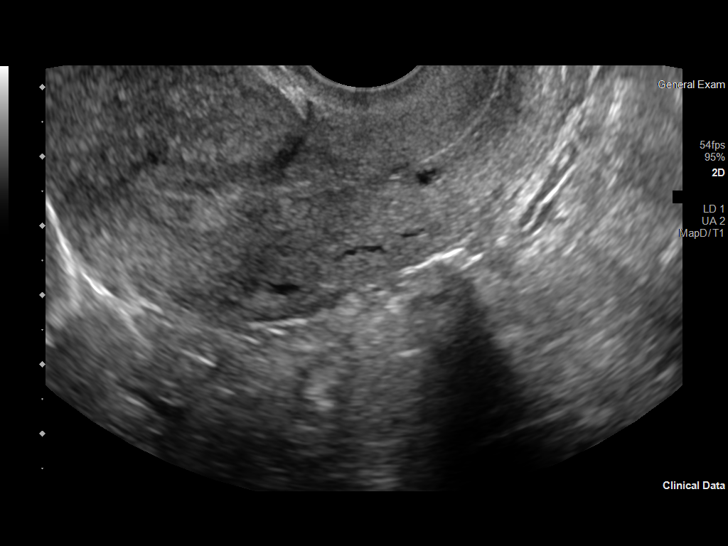

[13 of 25 positions shown; findings below may reference images not displayed]

FINDINGS: Uterus

Measurements: 6.7 x 4.3 x 5.8 cm = volume: 87.1 mL. No fibroids or
other mass visualized.

Endometrium

Thickness: 8 mm.  No focal abnormality visualized.

Right ovary

Measurements: 3.2 x 1.9 x 1.9 cm = volume: 6 mL. Normal
appearance/no adnexal mass.

Left ovary

Measurements: 2.0 x 1.1 x 2.1 cm = volume: 2.4 mL. Normal
appearance/no adnexal mass.

Pulsed Doppler evaluation of both ovaries demonstrates normal
low-resistance arterial and venous waveforms.

Other findings

Trace free fluid in the cul-de-sac.  Appears simple.

LEFT parametrial varices.
IMPRESSION: No acute pelvic findings.

No signs of ovarian torsion.

8 mm endometrium without focal abnormality. If bleeding remains
unresponsive to hormonal or medical therapy, sonohysterogram should
be considered for focal lesion work-up. (Ref: Radiological
Reasoning: Algorithmic Workup of Abnormal Vaginal Bleeding with
Endovaginal Sonography and Sonohysterography. AJR [id number]; [id number])

## 2021-08-15 IMAGING — DX DG FOOT COMPLETE 3+V*R*
3 series · 3 of 3 positions shown · non-contrast
Comparison: None.

CLINICAL DATA: Fall.  Pain

EXAM:
RIGHT FOOT COMPLETE - 3+ VIEW

[foot ap]
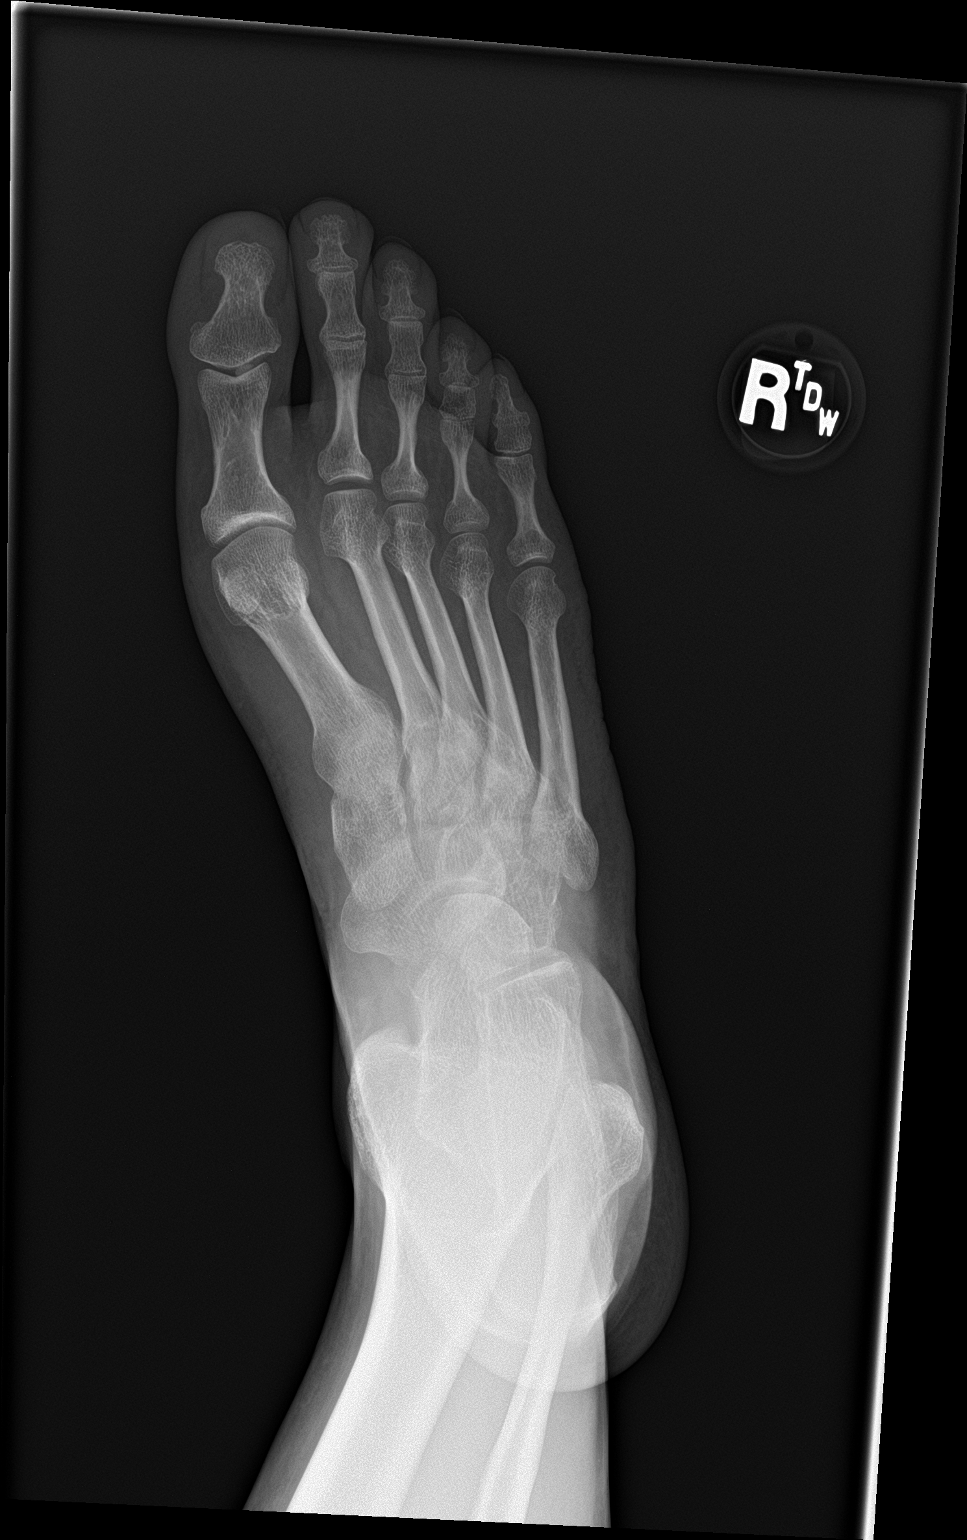

[foot obl]
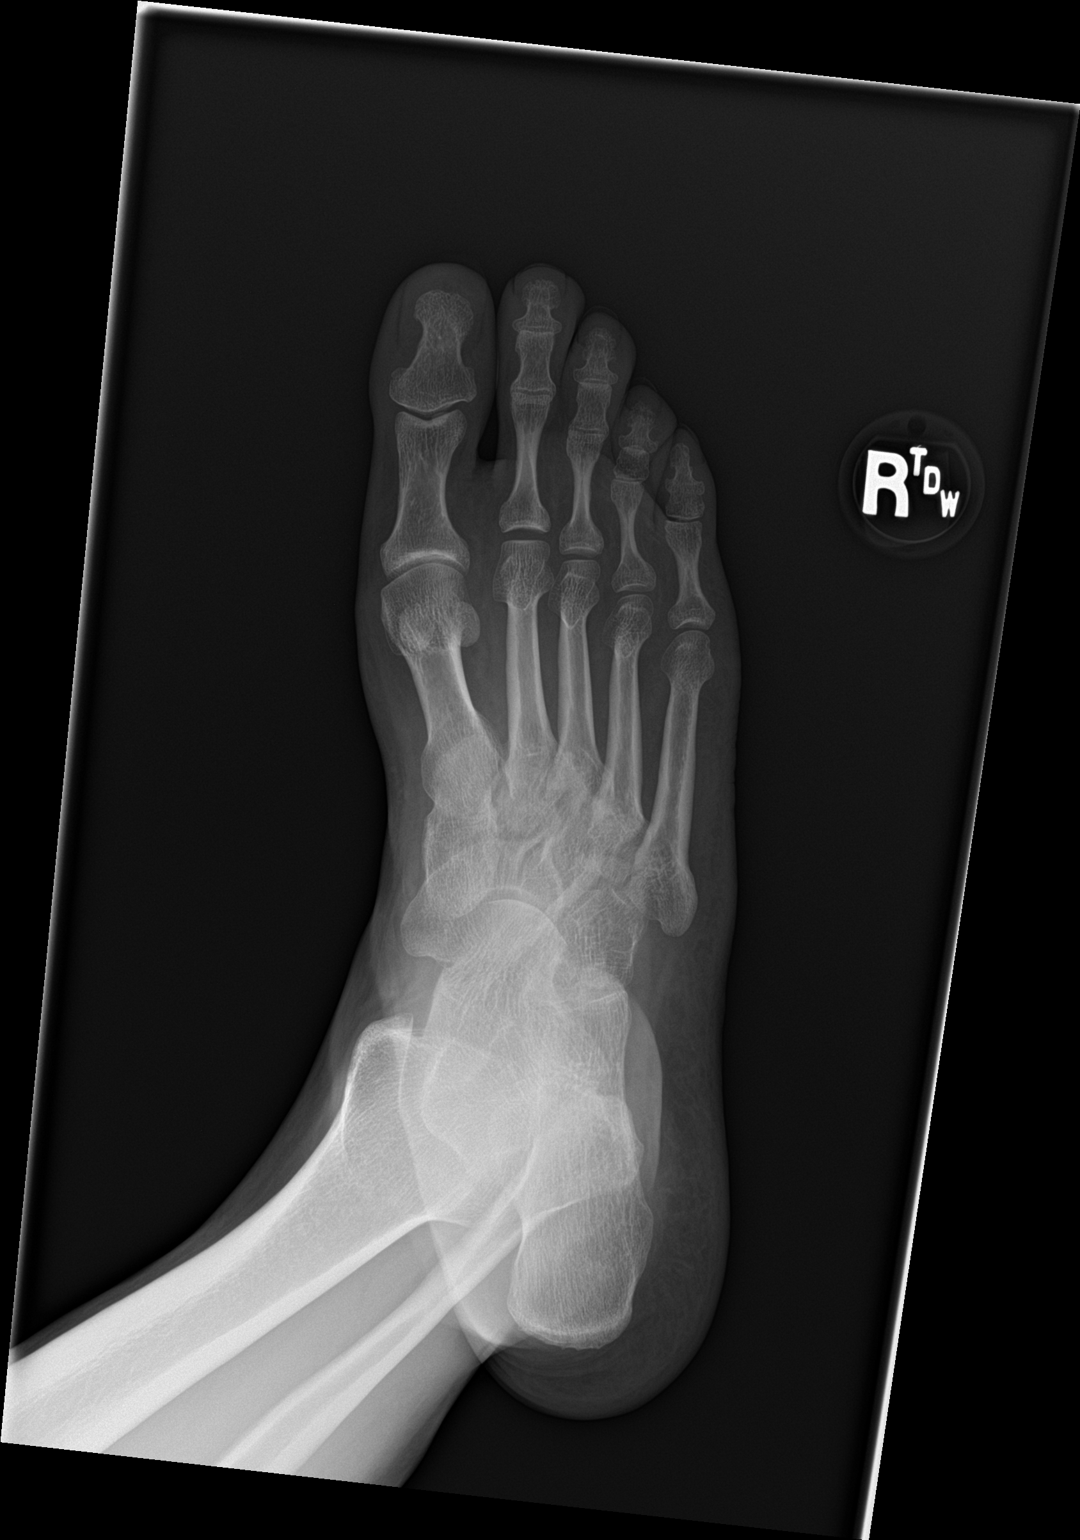

[foot lat]
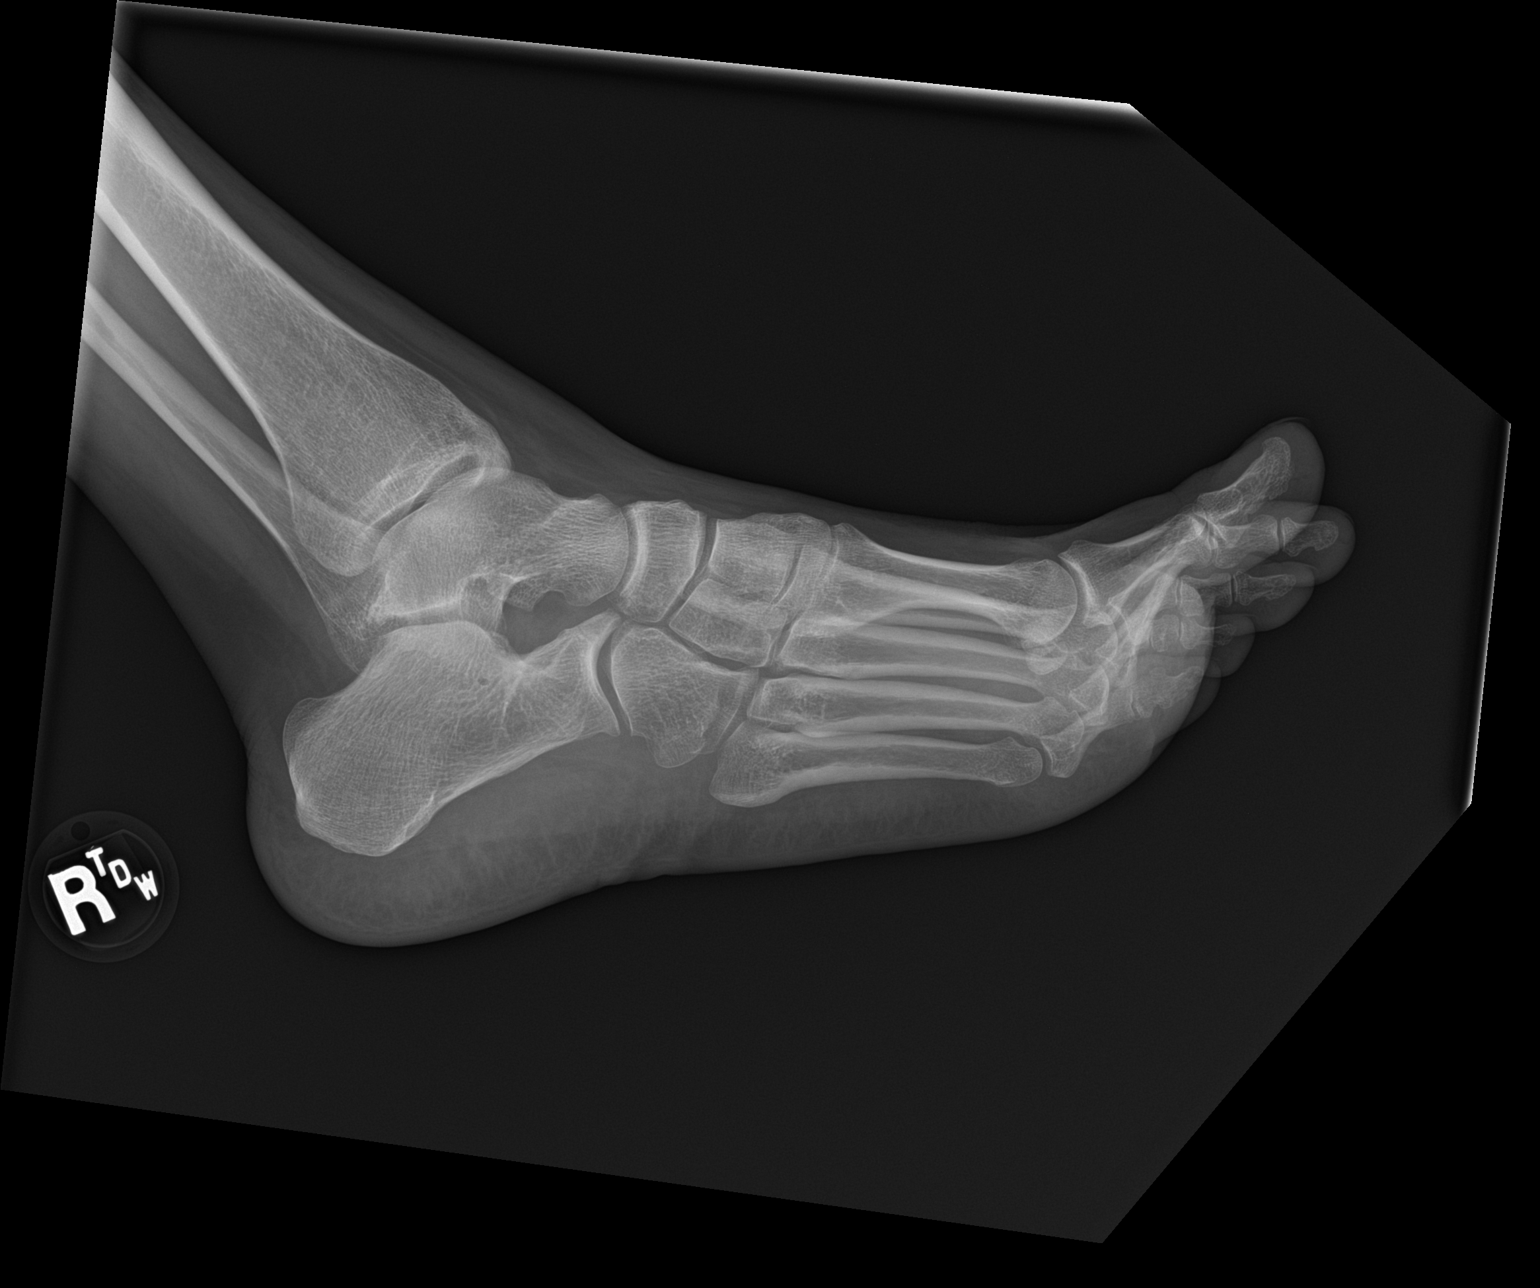

[3 of 3 positions shown; findings below may reference images not displayed]

FINDINGS: There is no evidence of fracture or dislocation. There is no
evidence of arthropathy or other focal bone abnormality. Soft
tissues are unremarkable.
IMPRESSION: Negative.

## 2022-10-22 ENCOUNTER — Encounter (HOSPITAL_COMMUNITY): Payer: Self-pay | Admitting: Emergency Medicine

## 2022-10-22 ENCOUNTER — Other Ambulatory Visit: Payer: Self-pay

## 2022-10-22 ENCOUNTER — Ambulatory Visit (HOSPITAL_COMMUNITY)
Admission: EM | Admit: 2022-10-22 | Discharge: 2022-10-22 | Disposition: A | Discharge status: Home / Self Care | Payer: Commercial Managed Care - HMO | Attending: Family Medicine | Admitting: Family Medicine

## 2022-10-22 DIAGNOSIS — N3090 Cystitis, unspecified without hematuria: Secondary | ICD-10-CM | POA: Diagnosis not present

## 2022-10-22 DIAGNOSIS — N939 Abnormal uterine and vaginal bleeding, unspecified: Secondary | ICD-10-CM | POA: Diagnosis not present

## 2022-10-22 DIAGNOSIS — Z113 Encounter for screening for infections with a predominantly sexual mode of transmission: Secondary | ICD-10-CM | POA: Diagnosis not present

## 2022-10-22 DIAGNOSIS — K0889 Other specified disorders of teeth and supporting structures: Secondary | ICD-10-CM | POA: Diagnosis present

## 2022-10-22 LAB — CBC
HCT: 32 % — ABNORMAL LOW (ref 36.0–46.0)
Hemoglobin: 9.3 g/dL — ABNORMAL LOW (ref 12.0–15.0)
MCH: 20.6 pg — ABNORMAL LOW (ref 26.0–34.0)
MCHC: 29.1 g/dL — ABNORMAL LOW (ref 30.0–36.0)
MCV: 71 fL — ABNORMAL LOW (ref 80.0–100.0)
Platelets: 343 10*3/uL (ref 150–400)
RBC: 4.51 MIL/uL (ref 3.87–5.11)
RDW: 17.6 % — ABNORMAL HIGH (ref 11.5–15.5)
WBC: 7.3 10*3/uL (ref 4.0–10.5)
nRBC: 0 % (ref 0.0–0.2)

## 2022-10-22 LAB — POCT URINALYSIS DIP (MANUAL ENTRY)
Bilirubin, UA: NEGATIVE
Blood, UA: NEGATIVE
Glucose, UA: NEGATIVE mg/dL
Nitrite, UA: POSITIVE — AB
Protein Ur, POC: 30 mg/dL — AB
Spec Grav, UA: 1.02 (ref 1.010–1.025)
Urobilinogen, UA: 0.2 E.U./dL
pH, UA: 7 (ref 5.0–8.0)

## 2022-10-22 LAB — POCT URINE PREGNANCY: Preg Test, Ur: NEGATIVE

## 2022-10-22 MED ORDER — CEPHALEXIN 500 MG PO CAPS: 500.0000 mg | ORAL_CAPSULE | Freq: Two times a day (BID) | ORAL | 0 refills | Status: DC

## 2022-10-22 MED ORDER — MEGESTROL ACETATE 40 MG PO TABS: 40.0000 mg | ORAL_TABLET | Freq: Two times a day (BID) | ORAL | 0 refills | Status: AC

## 2022-10-22 MED ORDER — IBUPROFEN 800 MG PO TABS: 800.0000 mg | ORAL_TABLET | Freq: Three times a day (TID) | ORAL | 0 refills | Status: DC

## 2022-10-22 NOTE — Discharge Instructions (Addendum)
In addition to a blood count and a urine culture, we have sent testing for sexually transmitted infections. We will notify you of any positive results once they are received. If required, we will prescribe any medications you might need.  Please refrain from all sexual activity for at least the next seven days.

## 2022-10-22 NOTE — ED Triage Notes (Addendum)
Left lower tooth is cracked per patient.  Pain for 3-4 days.  Patient has not used any medications.    Patient does not have a pcp.  Reports her iron is low and is not taking any iron pills.  Patient reports vaginal bleeding, heavy

## 2022-10-22 NOTE — ED Notes (Addendum)
Patient chose to make a phone call while performing intake

## 2022-10-24 LAB — CERVICOVAGINAL ANCILLARY ONLY
Bacterial Vaginitis (gardnerella): POSITIVE — AB
Candida Glabrata: NEGATIVE
Candida Vaginitis: POSITIVE — AB
Chlamydia: NEGATIVE
Comment: NEGATIVE
Comment: NEGATIVE
Comment: NEGATIVE
Comment: NEGATIVE
Comment: NEGATIVE
Comment: NORMAL
Neisseria Gonorrhea: NEGATIVE
Trichomonas: NEGATIVE

## 2022-10-24 LAB — URINE CULTURE: Culture: >=100000 — AB

## 2022-10-25 ENCOUNTER — Telehealth (HOSPITAL_COMMUNITY): Payer: Self-pay | Admitting: Emergency Medicine

## 2022-10-25 MED ORDER — METRONIDAZOLE 500 MG PO TABS: 500.0000 mg | ORAL_TABLET | Freq: Two times a day (BID) | ORAL | 0 refills | Status: DC

## 2022-10-26 NOTE — ED Provider Notes (Signed)
[facility]    ASSESSMENT & PLAN:  1. Pain, dental   2. Screening for STDs (sexually transmitted diseases)   3. Abnormal vaginal bleeding   4. Cystitis    Keflex should cover both possible dental infection and UTI. Discussed.  Meds ordered this encounter  Medications   cephALEXin (KEFLEX) 500 MG capsule    Sig: Take 1 capsule (500 mg total) by mouth 2 (two) times daily.    Dispense:  14 capsule    Refill:  0   ibuprofen (ADVIL) 800 MG tablet    Sig: Take 1 tablet (800 mg total) by mouth 3 (three) times daily with meals.    Dispense:  21 tablet    Refill:  0   megestrol (MEGACE) 40 MG tablet    Sig: Take 1 tablet (40 mg total) by mouth 2 (two) times daily.    Dispense:  60 tablet    Refill:  0   GYN for abnormal vaginal bleeding. UPT negative. Mildly anaemic but h/o similar .  Follow-up Information     [facility] at [facility].   Specialty: Emergency Medicine Why: If your vaginal bleeding significantly worsens before you see the gynecologist. Contact information: [address] [id number] mc [place] [address] [phone]               Results for orders placed or performed during the hospital encounter of 10/22/22  Urine Culture   Specimen: Urine, Clean Catch  Result Value Ref Range   Specimen Description URINE, CLEAN CATCH    Special Requests      NONE Performed at [facility], [address] [place], [place] [address]    Culture >=100,000 COLONIES/mL ESCHERICHIA COLI (A)    Report Status 10/24/2022 FINAL    Organism ID, Bacteria ESCHERICHIA COLI (A)       Susceptibility   Escherichia coli - MIC*    AMPICILLIN >=32 RESISTANT Resistant     CEFAZOLIN <=4 SENSITIVE Sensitive     CEFEPIME <=0.12 SENSITIVE Sensitive     CEFTRIAXONE <=0.25 SENSITIVE Sensitive     CIPROFLOXACIN >=4 RESISTANT Resistant     GENTAMICIN <=1 SENSITIVE Sensitive     IMIPENEM <=0.25 SENSITIVE Sensitive      NITROFURANTOIN <=16 SENSITIVE Sensitive     TRIMETH/SULFA >=320 RESISTANT Resistant     AMPICILLIN/SULBACTAM 8 SENSITIVE Sensitive     PIP/TAZO <=4 SENSITIVE Sensitive     * >=100,000 COLONIES/mL ESCHERICHIA COLI  CBC  Result Value Ref Range   WBC 7.3 4.0 - 10.5 K/uL   RBC 4.51 3.87 - 5.11 MIL/uL   Hemoglobin 9.3 (L) 12.0 - 15.0 g/dL   HCT 84.6 (L) 96.2 - 95.2 %   MCV 71.0 (L) 80.0 - 100.0 fL   MCH 20.6 (L) 26.0 - 34.0 pg   MCHC 29.1 (L) 30.0 - 36.0 g/dL   RDW 84.1 (H) 32.4 - 40.1 %   Platelets 343 150 - 400 K/uL   nRBC 0.0 0.0 - 0.2 %  POC urinalysis dipstick  Result Value Ref Range   Color, UA yellow yellow   Clarity, UA cloudy (A) clear   Glucose, UA negative negative mg/dL   Bilirubin, UA negative negative   Ketones, POC UA trace (5) (A) negative mg/dL   Spec Grav, UA 0.272 5.366 - 1.025   Blood, UA negative negative   pH, UA 7.0 5.0 - 8.0   Protein Ur, POC =30 (A) negative mg/dL   Urobilinogen,  UA 0.2 0.2 or 1.0 E.U./dL   Nitrite, UA Positive (A) Negative   Leukocytes, UA Trace (A) Negative  POCT urine pregnancy  Result Value Ref Range   Preg Test, Ur Negative Negative  Cervicovaginal ancillary only  Result Value Ref Range   Neisseria Gonorrhea Negative    Chlamydia Negative    Trichomonas Negative    Bacterial Vaginitis (gardnerella) Positive (A)    Candida Vaginitis Positive (A)    Candida Glabrata Negative    Comment      Normal Reference Range Bacterial Vaginosis - Negative   Comment Normal Reference Range Candida Species - Negative    Comment Normal Reference Range Candida Galbrata - Negative    Comment Normal Reference Range Trichomonas - Negative    Comment Normal Reference Ranger Chlamydia - Negative    Comment      Normal Reference Range Neisseria Gonorrhea - Negative    No signs of pyelonephritis. Urine culture sent. Will notify patient of any significant results. Will follow up with her PCP or here if not showing improvement over the next 48  hours, sooner if needed.  Outlined signs and symptoms indicating need for more acute intervention. Patient verbalized understanding. After Visit Summary given.  SUBJECTIVE:  Alexandra Ross is a 37 y.o. female who complains a left lower tooth is cracked per patient. Pain for 3-4 days. Denies fever. No tx PTA. Tolerating soft foods.  Reports h/o "iron being low"; has been on Fe in the past. Reports irregular and more freq vaginal bleeding; over years. Heavier recently.  Today with much dysuria. Would like STD testing. Denies abdominal pain. Ambulatory without difficulty.  LMP: Patient's last menstrual period was 10/22/2022.  OBJECTIVE:  Vitals:   10/22/22 1031  BP: 102/68  Pulse: 87  Resp: 20  Temp: 98.8 F (37.1 C)  TempSrc: Oral  SpO2: 98%   General appearance: alert; no distress HENT: oropharynx: moist; does appear to have a cracked cuspid on lower left; no signs of abscess Lungs: unlabored respirations Abdomen: soft, non-tender Back: no CVA tenderness GU: deferred Extremities: no edema; symmetrical with no gross deformities Skin: warm and dry Neurologic: normal gait Psychological: alert and cooperative; normal mood and affect  Labs Reviewed  URINE CULTURE - Abnormal; Notable for the following components:      Result Value   Culture >=100,000 COLONIES/mL ESCHERICHIA COLI (*)    Organism ID, Bacteria ESCHERICHIA COLI (*)    All other components within normal limits  CBC - Abnormal; Notable for the following components:   Hemoglobin 9.3 (*)    HCT 32.0 (*)    MCV 71.0 (*)    MCH 20.6 (*)    MCHC 29.1 (*)    RDW 17.6 (*)    All other components within normal limits  POCT URINALYSIS DIP (MANUAL ENTRY) - Abnormal; Notable for the following components:   Clarity, UA cloudy (*)    Ketones, POC UA trace (5) (*)    Protein Ur, POC =30 (*)    Nitrite, UA Positive (*)    Leukocytes, UA Trace (*)    All other components within normal limits  CERVICOVAGINAL ANCILLARY ONLY  - Abnormal; Notable for the following components:   Bacterial Vaginitis (gardnerella) Positive (*)    Candida Vaginitis Positive (*)    All other components within normal limits  POCT URINE PREGNANCY    Allergies  Allergen Reactions   Shellfish Allergy     Past Medical History:  Diagnosis Date   Anemia  Asthma    Depression    doing ok, never on meds, no official dx   Headache    Social History   Socioeconomic History   Marital status: Single    Spouse name: Not on file   Number of children: Not on file   Years of education: Not on file   Highest education level: Not on file  Occupational History   Not on file  Tobacco Use   Smoking status: Never   Smokeless tobacco: Never  Vaping Use   Vaping Use: Never used  Substance and Sexual Activity   Alcohol use: No   Drug use: No   Sexual activity: Not Currently    Birth control/protection: None  Other Topics Concern   Not on file  Social History Narrative   Not on file   Social Determinants of Health   Financial Resource Strain: Not on file  Food Insecurity: Not on file  Transportation Needs: Not on file  Physical Activity: Not on file  Stress: Not on file  Social Connections: Not on file  Intimate Partner Violence: Not on file   Family History  Problem Relation Age of Onset   Hypertension Mother    Lupus Mother    Diabetes Maternal Grandmother    Hypertension Maternal Grandmother    Anuerysm Paternal Dulce Sellar, MD 10/26/22 1021

## 2022-11-17 ENCOUNTER — Encounter (HOSPITAL_COMMUNITY): Payer: Self-pay | Admitting: Emergency Medicine

## 2022-11-17 ENCOUNTER — Other Ambulatory Visit: Payer: Self-pay

## 2022-11-17 ENCOUNTER — Emergency Department (HOSPITAL_COMMUNITY)
Admission: EM | Admit: 2022-11-17 | Discharge: 2022-11-17 | Discharge status: Against Medical Advice | Payer: Commercial Managed Care - HMO | Attending: Emergency Medicine | Admitting: Emergency Medicine

## 2022-11-17 DIAGNOSIS — R059 Cough, unspecified: Secondary | ICD-10-CM | POA: Diagnosis present

## 2022-11-17 DIAGNOSIS — Z1152 Encounter for screening for COVID-19: Secondary | ICD-10-CM | POA: Diagnosis not present

## 2022-11-17 DIAGNOSIS — Z5321 Procedure and treatment not carried out due to patient leaving prior to being seen by health care provider: Secondary | ICD-10-CM | POA: Diagnosis not present

## 2022-11-17 LAB — SARS CORONAVIRUS 2 BY RT PCR: SARS Coronavirus 2 by RT PCR: NEGATIVE

## 2022-11-17 NOTE — ED Triage Notes (Signed)
Pt states she is going to leave because she has another appointment at 4pm and can't wait to see a doctor

## 2022-11-17 NOTE — ED Triage Notes (Signed)
Pt complains of nonproductive cough x 2 days with no fever.

## 2023-01-20 ENCOUNTER — Encounter (HOSPITAL_COMMUNITY): Payer: Self-pay | Admitting: *Deleted

## 2023-01-20 ENCOUNTER — Other Ambulatory Visit: Payer: Self-pay

## 2023-01-20 ENCOUNTER — Emergency Department (HOSPITAL_COMMUNITY)
Admission: EM | Admit: 2023-01-20 | Discharge: 2023-01-20 | Disposition: A | Discharge status: Home / Self Care | Payer: Commercial Managed Care - HMO

## 2023-01-20 DIAGNOSIS — O9A219 Injury, poisoning and certain other consequences of external causes complicating pregnancy, unspecified trimester: Secondary | ICD-10-CM | POA: Insufficient documentation

## 2023-01-20 DIAGNOSIS — S0083XA Contusion of other part of head, initial encounter: Secondary | ICD-10-CM

## 2023-01-20 DIAGNOSIS — S0012XA Contusion of left eyelid and periocular area, initial encounter: Secondary | ICD-10-CM | POA: Insufficient documentation
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
## 2023-01-20 NOTE — ED Triage Notes (Signed)
The pt refuses lab work  only here for papers  see the pt???
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
## 2023-01-20 NOTE — Discharge Instructions (Signed)
Follow up with Ob/gyn for prenatal care as scheduled
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
## 2023-01-20 NOTE — ED Notes (Signed)
Pt requesting po fluids in order to get urine specimen. Water given. Pt encouraged to try to get urine specimen.

## 2023-02-10 ENCOUNTER — Encounter (HOSPITAL_COMMUNITY): Payer: Self-pay | Admitting: *Deleted

## 2023-02-10 ENCOUNTER — Other Ambulatory Visit: Payer: Self-pay

## 2023-02-10 ENCOUNTER — Ambulatory Visit (HOSPITAL_COMMUNITY)
Admission: EM | Admit: 2023-02-10 | Discharge: 2023-02-10 | Disposition: A | Discharge status: Home / Self Care | Payer: Medicaid Other | Attending: Emergency Medicine | Admitting: Emergency Medicine

## 2023-02-10 DIAGNOSIS — K047 Periapical abscess without sinus: Secondary | ICD-10-CM | POA: Diagnosis not present

## 2023-02-10 DIAGNOSIS — J069 Acute upper respiratory infection, unspecified: Secondary | ICD-10-CM | POA: Diagnosis not present

## 2023-02-10 MED ORDER — AMOXICILLIN-POT CLAVULANATE 875-125 MG PO TABS: 1.0000 | ORAL_TABLET | Freq: Two times a day (BID) | ORAL | 0 refills | Status: DC

## 2023-02-10 MED ORDER — ACETAMINOPHEN 325 MG PO TABS
975.0000 mg | ORAL_TABLET | Freq: Once | ORAL | Status: AC
  Administered 2023-02-10: 975 mg via ORAL

## 2023-02-10 MED ORDER — ACETAMINOPHEN 325 MG PO TABS
ORAL_TABLET | ORAL | Status: AC
  Filled 2023-02-10: qty 3

## 2023-02-10 NOTE — ED Provider Notes (Signed)
[facility]    CSN: [id number] Arrival date & time: 02/10/23  1442      History   Chief Complaint Chief Complaint  Patient presents with   Cough   Dental Problem    HPI Alexandra Ross is a 37 y.o. female. Reports cough and sore throat for 3 days. Does not want covid test because she had one end of September that was negative and because every time she gets a covid test it makes her sicker.   Also c/o dental pain/infection. Has old broken tooth with roots still in gum. Wants anbiotics. Does not have dental appt for this yet.   States she is 5 months pregnant with twins. Review of records shows negative pregnancy test 10/22/22. ED visit at end of September refused pregnancy test to verify pregnancy status.    Cough   Past Medical History:  Diagnosis Date   Anemia    Asthma    Depression    doing ok, never on meds, no official dx   Headache     There are no problems to display for this patient.   Past Surgical History:  Procedure Laterality Date   CESAREAN SECTION      OB History     Gravida  3   Para  3   Term  3   Preterm      AB      Living  3      SAB      IAB      Ectopic      Multiple      Live Births           Obstetric Comments  First baby was breech          Home Medications    Prior to Admission medications   Medication Sig Start Date End Date Taking? Authorizing Provider  amoxicillin-clavulanate (AUGMENTIN) 875-125 MG tablet Take 1 tablet by mouth 2 (two) times daily. 02/10/23  Yes Cathlyn Parsons, NP  acetaminophen (TYLENOL) 500 MG tablet Take 500 mg by mouth every 6 (six) hours as needed for moderate pain.    [provider]  ibuprofen (ADVIL) 800 MG tablet Take 1 tablet (800 mg total) by mouth 3 (three) times daily with meals. 10/22/22   Mardella Layman, MD  lidocaine (XYLOCAINE) 2 % solution Use as directed 10 mLs in the mouth or throat every 3 (three) hours as needed for mouth pain. Patient not  taking: Reported on 10/22/2022 03/23/21   Particia Nearing, PA-C    Family History Family History  Problem Relation Age of Onset   Hypertension Mother    Lupus Mother    Diabetes Maternal Grandmother    Hypertension Maternal Grandmother    Anuerysm Paternal Grandmother     Social History Social History   Tobacco Use   Smoking status: Never   Smokeless tobacco: Never  Vaping Use   Vaping status: Never Used  Substance Use Topics   Alcohol use: No   Drug use: No     Allergies   Shellfish allergy   Review of Systems Review of Systems  Respiratory:  Positive for cough.      Physical Exam Triage Vital Signs ED Triage Vitals  Encounter Vitals Group     BP 02/10/23 1533 102/66     Systolic BP Percentile --      Diastolic BP Percentile --      Pulse Rate 02/10/23 1533 98     Resp  02/10/23 1533 18     Temp 02/10/23 1533 98.3 F (36.8 C)     Temp src --      SpO2 02/10/23 1533 97 %     Weight --      Height --      Head Circumference --      Peak Flow --      Pain Score 02/10/23 1531 10     Pain Loc --      Pain Education --      Exclude from Growth Chart --    No data found.  Updated Vital Signs BP 102/66   Pulse 98   Temp 98.3 F (36.8 C)   Resp 18   LMP 11/18/2022   SpO2 97%   Visual Acuity Right Eye Distance:   Left Eye Distance:   Bilateral Distance:    Right Eye Near:   Left Eye Near:    Bilateral Near:     Physical Exam Constitutional:      Appearance: Normal appearance.  HENT:     Right Ear: Tympanic membrane, ear canal and external ear normal.     Left Ear: Tympanic membrane, ear canal and external ear normal.     Nose: No congestion.     Mouth/Throat:     Mouth: Mucous membranes are moist.     Pharynx: Oropharynx is clear. No oropharyngeal exudate or posterior oropharyngeal erythema.     Comments: Missing tooth L lower jaw in premolar area, roots retained, decay present.  Cardiovascular:     Rate and Rhythm: Normal rate  and regular rhythm.  Pulmonary:     Effort: Pulmonary effort is normal.     Breath sounds: Normal breath sounds.     Comments: Occasional dry cough during exam Lymphadenopathy:     Cervical: No cervical adenopathy.  Neurological:     Mental Status: She is alert.      UC Treatments / Results  Labs (all labs ordered are listed, but only abnormal results are displayed) Labs Reviewed - No data to display  EKG   Radiology No results found.  Procedures Procedures (including critical care time)  Medications Ordered in UC Medications  acetaminophen (TYLENOL) tablet 975 mg (has no administration in time range)    Initial Impression / Assessment and Plan / UC Course  I have reviewed the triage vital signs and the nursing notes.  Pertinent labs & imaging results that were available during my care of the patient were reviewed by me and considered in my medical decision making (see chart for details).    Explained to pt tylenol is safest pain medicine since I am uncertain of pregnancy status. Rx augmentin for dental infection and instructed to f/u with dentist.   Final Clinical Impressions(s) / UC Diagnoses   Final diagnoses:  Viral URI with cough  Dental infection     Discharge Instructions      Please follow up with a dentist. Contact Medicaid and see if they can help you find a dentist to go to  If you are pregnant, the only cold medicine that is safe to take is Mucinex. I do not recommend you take any others. It's ok to take tylenol for pain per package instructions   ED Prescriptions     Medication Sig Dispense Auth. Provider   amoxicillin-clavulanate (AUGMENTIN) 875-125 MG tablet Take 1 tablet by mouth 2 (two) times daily. 14 tablet Cathlyn Parsons, NP      PDMP not reviewed this  encounter.   Cathlyn Parsons, NP 02/10/23 [phone]

## 2023-02-10 NOTE — ED Triage Notes (Signed)
Pt reports for 2 days she has had dental pain,cough and sore throat. Pt also is pregnant.

## 2023-02-10 NOTE — Discharge Instructions (Signed)
Please follow up with a dentist. Contact Medicaid and see if they can help you find a dentist to go to  If you are pregnant, the only cold medicine that is safe to take is Mucinex. I do not recommend you take any others. It's ok to take tylenol for pain per package instructions

## 2023-06-01 ENCOUNTER — Encounter (HOSPITAL_COMMUNITY): Payer: Self-pay

## 2023-06-01 ENCOUNTER — Ambulatory Visit (HOSPITAL_COMMUNITY)
Admission: EM | Admit: 2023-06-01 | Discharge: 2023-06-01 | Disposition: A | Discharge status: Home / Self Care | Payer: MEDICAID | Attending: Physician Assistant | Admitting: Physician Assistant

## 2023-06-01 DIAGNOSIS — K0889 Other specified disorders of teeth and supporting structures: Secondary | ICD-10-CM | POA: Diagnosis not present

## 2023-06-01 MED ORDER — IBUPROFEN 800 MG PO TABS: 800.0000 mg | ORAL_TABLET | Freq: Three times a day (TID) | ORAL | 0 refills | Status: AC

## 2023-06-01 MED ORDER — AMOXICILLIN-POT CLAVULANATE 875-125 MG PO TABS: 1.0000 | ORAL_TABLET | Freq: Two times a day (BID) | ORAL | 0 refills | Status: AC

## 2023-06-01 NOTE — ED Provider Notes (Signed)
 [facility]   MRN: [id number] DOB: Jun 05, 1985  Subjective:   Alexandra Ross is a 38 y.o. female presenting for dental pain, worse in the last 3 days.  Patient reports that she has a history of poor dentition and has been trying to work with a child psychotherapist on receiving regular care.  She states that finances are an issue for her.  She has had some swelling on the right lower side of her mouth.  It is painful to eat.  She also has a cracked tooth on her left lower jaw that is painful and cutting her tongue at times because it so sharp.  She has been taking Tylenol  at home without much relief of the pain.  No fevers.  No taste of pus.  No current facility-administered medications for this encounter.  Current Outpatient Medications:    acetaminophen  (TYLENOL ) 500 MG tablet, Take 500 mg by mouth every 6 (six) hours as needed for moderate pain., Disp: , Rfl:    amoxicillin -clavulanate (AUGMENTIN ) 875-125 MG tablet, Take 1 tablet by mouth 2 (two) times daily. Take with food., Disp: 14 tablet, Rfl: 0   ibuprofen  (ADVIL ) 800 MG tablet, Take 1 tablet (800 mg total) by mouth 3 (three) times daily with meals., Disp: 21 tablet, Rfl: 0   lidocaine  (XYLOCAINE ) 2 % solution, Use as directed 10 mLs in the mouth or throat every 3 (three) hours as needed for mouth pain. (Patient not taking: Reported on 10/22/2022), Disp: 100 mL, Rfl: 0   Allergies  Allergen Reactions   Shellfish Allergy     Past Medical History:  Diagnosis Date   Anemia    Asthma    Depression    doing ok, never on meds, no official dx   Headache      Past Surgical History:  Procedure Laterality Date   CESAREAN SECTION      Family History  Problem Relation Age of Onset   Hypertension Mother    Lupus Mother    Diabetes Maternal Grandmother    Hypertension Maternal Grandmother    Anuerysm Paternal Grandmother     Social History   Tobacco Use   Smoking status: Never   Smokeless tobacco: Never  Vaping Use    Vaping status: Never Used  Substance Use Topics   Alcohol use: No   Drug use: No    ROS REFER TO HPI FOR PERTINENT POSITIVES AND NEGATIVES   Objective:   Vitals: BP 112/64   Pulse 87   Temp 98.1 F (36.7 C) (Oral)   Resp 17   LMP 05/01/2023 (Exact Date)   SpO2 100%   Physical Exam Vitals and nursing note reviewed.  Constitutional:      General: She is not in acute distress.    Appearance: Normal appearance. She is not ill-appearing.  HENT:     Head: Normocephalic.     Right Ear: External ear normal.     Left Ear: External ear normal.     Nose: No congestion.     Mouth/Throat:     Mouth: Mucous membranes are moist.     Dentition: Abnormal dentition. Dental tenderness and dental caries present. No dental abscesses or gum lesions.     Pharynx: No oropharyngeal exudate or posterior oropharyngeal erythema.   Eyes:     Extraocular Movements: Extraocular movements intact.     Conjunctiva/sclera: Conjunctivae normal.     Pupils: Pupils are equal, round, and reactive to light.  Musculoskeletal:  Cervical back: Normal range of motion.  Skin:    General: Skin is warm.  Neurological:     Mental Status: She is alert and oriented to person, place, and time.  Psychiatric:        Mood and Affect: Mood normal.        Behavior: Behavior normal.     No results found for this or any previous visit (from the past 24 hours).  Assessment and Plan :   I have reviewed the PDMP during this encounter.  1. Pain, dental    Reassured patient, no signs of abscess.  Due to the increase in her pain over the last few days, will cover with Augmentin  antibiotic as directed.  Also gave prescription for ibuprofen  800 mg to take as directed.  A soft food diet is recommended.  She needs to continue follow-up with her social worker on receiving regular dental care.  Oral hygiene advised.  Return to care precautions discussed.    AllwardtMardy HERO, PA-C 06/01/23 [phone]

## 2023-06-01 NOTE — ED Triage Notes (Signed)
 Pt presents with c/o dental pain x 3 days. States she has swelling on the rt side of her mouth

## 2023-06-01 NOTE — Discharge Instructions (Signed)
 Good to meet you today.  Please take the Augmentin  as directed to help with dental infection.  Take the ibuprofen  800 mg up to 3 times daily to help with pain and inflammation.  You may also alternate the ibuprofen  with Tylenol .  Please stick to a soft food diet.  Continue to work with your social worker on receiving dental care.  Be sure to brush daily and rinse with Listerine.
